# Patient Record
Sex: Female | Born: 1960 | Race: Black or African American | Hispanic: No | Marital: Married | Smoking: Current every day smoker
Health system: Southern US, Community
[De-identification: ages and names within clinical notes are randomized; demographics above are authoritative.]

## PROBLEM LIST (undated history)

## (undated) DIAGNOSIS — D473 Essential (hemorrhagic) thrombocythemia: Secondary | ICD-10-CM

## (undated) DIAGNOSIS — L309 Dermatitis, unspecified: Secondary | ICD-10-CM

## (undated) DIAGNOSIS — B019 Varicella without complication: Secondary | ICD-10-CM

## (undated) DIAGNOSIS — D649 Anemia, unspecified: Secondary | ICD-10-CM

## (undated) DIAGNOSIS — R4586 Emotional lability: Secondary | ICD-10-CM

## (undated) DIAGNOSIS — R05 Cough: Secondary | ICD-10-CM

## (undated) HISTORY — DX: Varicella without complication: B01.9

## (undated) HISTORY — DX: Emotional lability: R45.86

## (undated) HISTORY — DX: Dermatitis, unspecified: L30.9

## (undated) HISTORY — PX: ABDOMINAL HYSTERECTOMY: SHX81

## (undated) HISTORY — DX: Cough: R05

## (undated) HISTORY — PX: DILATION AND CURETTAGE OF UTERUS: SHX78

## (undated) HISTORY — DX: Anemia, unspecified: D64.9

## (undated) HISTORY — DX: Essential (hemorrhagic) thrombocythemia: D47.3

---

## 1999-01-09 ENCOUNTER — Emergency Department (HOSPITAL_COMMUNITY): Admission: EM | Admit: 1999-01-09 | Discharge: 1999-01-09 | Payer: Self-pay | Admitting: Emergency Medicine

## 1999-02-22 ENCOUNTER — Emergency Department (HOSPITAL_COMMUNITY): Admission: EM | Admit: 1999-02-22 | Discharge: 1999-02-22 | Payer: Self-pay | Admitting: Emergency Medicine

## 1999-06-16 ENCOUNTER — Emergency Department (HOSPITAL_COMMUNITY): Admission: EM | Admit: 1999-06-16 | Discharge: 1999-06-16 | Payer: Self-pay | Admitting: Internal Medicine

## 1999-12-22 ENCOUNTER — Emergency Department (HOSPITAL_COMMUNITY): Admission: EM | Admit: 1999-12-22 | Discharge: 1999-12-22 | Payer: Self-pay | Admitting: Emergency Medicine

## 2000-05-19 ENCOUNTER — Emergency Department (HOSPITAL_COMMUNITY): Admission: EM | Admit: 2000-05-19 | Discharge: 2000-05-19 | Payer: Self-pay

## 2000-12-20 ENCOUNTER — Other Ambulatory Visit: Admission: RE | Admit: 2000-12-20 | Discharge: 2000-12-20 | Payer: Self-pay | Admitting: Gynecology

## 2000-12-24 ENCOUNTER — Encounter: Payer: Self-pay | Admitting: Gynecology

## 2000-12-24 ENCOUNTER — Encounter: Admission: RE | Admit: 2000-12-24 | Discharge: 2000-12-24 | Payer: Self-pay | Admitting: Gynecology

## 2000-12-24 ENCOUNTER — Other Ambulatory Visit: Admission: RE | Admit: 2000-12-24 | Discharge: 2000-12-24 | Payer: Self-pay | Admitting: Gynecology

## 2001-01-20 ENCOUNTER — Encounter: Payer: Self-pay | Admitting: Surgery

## 2001-01-20 ENCOUNTER — Encounter (INDEPENDENT_AMBULATORY_CARE_PROVIDER_SITE_OTHER): Payer: Self-pay | Admitting: *Deleted

## 2001-01-20 ENCOUNTER — Ambulatory Visit (HOSPITAL_BASED_OUTPATIENT_CLINIC_OR_DEPARTMENT_OTHER): Admission: RE | Admit: 2001-01-20 | Discharge: 2001-01-20 | Payer: Self-pay | Admitting: Surgery

## 2001-01-20 ENCOUNTER — Encounter: Admission: RE | Admit: 2001-01-20 | Discharge: 2001-01-20 | Payer: Self-pay | Admitting: Surgery

## 2001-01-20 HISTORY — PX: BREAST BIOPSY: SHX20

## 2003-01-26 ENCOUNTER — Other Ambulatory Visit: Admission: RE | Admit: 2003-01-26 | Discharge: 2003-01-26 | Payer: Self-pay | Admitting: Family Medicine

## 2003-08-04 ENCOUNTER — Emergency Department (HOSPITAL_COMMUNITY): Admission: EM | Admit: 2003-08-04 | Discharge: 2003-08-04 | Payer: Self-pay | Admitting: Emergency Medicine

## 2004-09-14 HISTORY — PX: BREAST EXCISIONAL BIOPSY: SUR124

## 2004-12-19 ENCOUNTER — Other Ambulatory Visit: Admission: RE | Admit: 2004-12-19 | Discharge: 2004-12-19 | Payer: Self-pay | Admitting: Obstetrics and Gynecology

## 2005-01-09 ENCOUNTER — Encounter: Admission: RE | Admit: 2005-01-09 | Discharge: 2005-01-09 | Payer: Self-pay | Admitting: Obstetrics and Gynecology

## 2005-01-30 ENCOUNTER — Encounter: Admission: RE | Admit: 2005-01-30 | Discharge: 2005-01-30 | Payer: Self-pay | Admitting: Obstetrics and Gynecology

## 2005-02-06 ENCOUNTER — Encounter (INDEPENDENT_AMBULATORY_CARE_PROVIDER_SITE_OTHER): Payer: Self-pay | Admitting: *Deleted

## 2005-02-06 ENCOUNTER — Encounter: Admission: RE | Admit: 2005-02-06 | Discharge: 2005-02-06 | Payer: Self-pay | Admitting: Obstetrics and Gynecology

## 2005-02-06 HISTORY — PX: BREAST BIOPSY: SHX20

## 2005-03-19 ENCOUNTER — Ambulatory Visit (HOSPITAL_BASED_OUTPATIENT_CLINIC_OR_DEPARTMENT_OTHER): Admission: RE | Admit: 2005-03-19 | Discharge: 2005-03-19 | Payer: Self-pay | Admitting: Surgery

## 2005-03-19 ENCOUNTER — Encounter (INDEPENDENT_AMBULATORY_CARE_PROVIDER_SITE_OTHER): Payer: Self-pay | Admitting: *Deleted

## 2005-03-19 ENCOUNTER — Ambulatory Visit (HOSPITAL_COMMUNITY): Admission: RE | Admit: 2005-03-19 | Discharge: 2005-03-19 | Payer: Self-pay | Admitting: Surgery

## 2005-03-19 ENCOUNTER — Encounter: Admission: RE | Admit: 2005-03-19 | Discharge: 2005-03-19 | Payer: Self-pay | Admitting: Surgery

## 2005-05-20 ENCOUNTER — Emergency Department (HOSPITAL_COMMUNITY): Admission: EM | Admit: 2005-05-20 | Discharge: 2005-05-20 | Payer: Self-pay | Admitting: Emergency Medicine

## 2006-06-11 ENCOUNTER — Encounter: Admission: RE | Admit: 2006-06-11 | Discharge: 2006-06-11 | Payer: Self-pay | Admitting: Obstetrics and Gynecology

## 2007-04-28 ENCOUNTER — Ambulatory Visit: Payer: Self-pay | Admitting: Family Medicine

## 2007-07-22 ENCOUNTER — Other Ambulatory Visit: Admission: RE | Admit: 2007-07-22 | Discharge: 2007-07-22 | Payer: Self-pay | Admitting: Family Medicine

## 2007-07-22 ENCOUNTER — Ambulatory Visit: Payer: Self-pay | Admitting: Family Medicine

## 2007-08-16 ENCOUNTER — Encounter: Admission: RE | Admit: 2007-08-16 | Discharge: 2007-08-16 | Payer: Self-pay | Admitting: Family Medicine

## 2007-08-23 ENCOUNTER — Ambulatory Visit: Payer: Self-pay | Admitting: Family Medicine

## 2007-09-06 ENCOUNTER — Ambulatory Visit: Payer: Self-pay | Admitting: Oncology

## 2008-01-10 ENCOUNTER — Ambulatory Visit: Payer: Self-pay | Admitting: Family Medicine

## 2008-01-18 ENCOUNTER — Ambulatory Visit: Payer: Self-pay | Admitting: Family Medicine

## 2008-01-31 ENCOUNTER — Ambulatory Visit: Payer: Self-pay | Admitting: Oncology

## 2008-03-09 ENCOUNTER — Ambulatory Visit: Payer: Self-pay | Admitting: Oncology

## 2009-02-01 ENCOUNTER — Ambulatory Visit: Payer: Self-pay | Admitting: Family Medicine

## 2009-09-14 HISTORY — PX: BREAST EXCISIONAL BIOPSY: SUR124

## 2010-06-19 ENCOUNTER — Ambulatory Visit: Payer: Self-pay | Admitting: Physician Assistant

## 2010-06-19 ENCOUNTER — Other Ambulatory Visit: Admission: RE | Admit: 2010-06-19 | Discharge: 2010-06-19 | Payer: Self-pay | Admitting: Family Medicine

## 2010-06-25 ENCOUNTER — Encounter: Admission: RE | Admit: 2010-06-25 | Discharge: 2010-06-25 | Payer: Self-pay | Admitting: Family Medicine

## 2010-06-27 ENCOUNTER — Encounter: Admission: RE | Admit: 2010-06-27 | Discharge: 2010-06-27 | Payer: Self-pay | Admitting: Family Medicine

## 2010-06-27 HISTORY — PX: BREAST BIOPSY: SHX20

## 2010-07-28 ENCOUNTER — Encounter: Admission: RE | Admit: 2010-07-28 | Discharge: 2010-07-28 | Payer: Self-pay | Admitting: Surgery

## 2010-07-28 ENCOUNTER — Ambulatory Visit (HOSPITAL_BASED_OUTPATIENT_CLINIC_OR_DEPARTMENT_OTHER): Admission: RE | Admit: 2010-07-28 | Discharge: 2010-07-28 | Payer: Self-pay | Admitting: Surgery

## 2010-10-05 ENCOUNTER — Encounter: Payer: Self-pay | Admitting: Obstetrics and Gynecology

## 2010-11-25 LAB — POCT HEMOGLOBIN-HEMACUE: Hemoglobin: 13.3 g/dL (ref 12.0–15.0)

## 2011-01-30 NOTE — Op Note (Signed)
Tamara Espinoza, Tamara Espinoza              ACCOUNT NO.:  [id number]   MEDICAL RECORD NO.:  [id number]          PATIENT TYPE:  AMB   LOCATION:  DSC                          FACILITY:  [facility]   PHYSICIAN:  Currie Paris, M.D.DATE OF BIRTH:  1960-09-28   DATE OF PROCEDURE:  03/19/2005  DATE OF DISCHARGE:                                 OPERATIVE REPORT   CCS [id number].   PREOPERATIVE DIAGNOSIS:  Left breast mass.   POSTOPERATIVE DIAGNOSIS:  Left breast mass.   OPERATION:  Needle guided excision of left breast mass.   SURGEON:  Dr. Jamey Ripa.   ANESTHESIA:  General.   CLINICAL HISTORY:  This is a 44-hour lady with a mass in the left breast  with some calcifications and a biopsy not clearly diagnostic for benign  disease and excisional biopsy was recommended.   DESCRIPTION OF PROCEDURE:  The patient was seen in the holding area and had  no questions. She confirmed the left side as the operative side and we both  marked the left breast as the operative site.   I reviewed the films and the guidewire appeared to be placed appropriately.  She was therefore taken to the operating room.   After satisfactory general anesthesia had been obtained, the left breast was  prepped and draped. The time-out occurred.   The guidewire entered laterally and tracked medially so I made a transverse  incision over the guidewire tract. I manipulated the guidewire into the  tract and then took a cylinder of tissue around the guidewire starting  laterally working medially going in all directions staying about a  centimeter around it so that we had a wide area to make sure I was  completely around the area of abnormality. The excision continued to the end  of the guidewire. Once this was out, we sent it for specimen mammography. I  palpated the residual breast and just along the superior edge of what I had  just excised was another small mass which might have represented a small  cyst and some very dense  fibrotic breast tissue, but I excised and sent as a  separate specimen.   Once hemostasis was achieved, I infiltrated the area with 0.25% plain  Marcaine. We made a final check for hemostasis and closed in layers with 3-0  Vicryl, 4-0 Monocryl subcuticular, and Dermabond on the skin.   The patient tolerated the procedure well. There were no operative  complications. All counts were correct. Radiology reported that the specimen  mammogram appeared to contain the abnormality.       CJS/MEDQ  D:  03/19/2005  T:  03/19/2005  Job:  [id number]

## 2011-01-30 NOTE — Op Note (Signed)
[place]  Patient:    Tamara Espinoza, Tamara Espinoza                     MRN: [id number] Proc. Date: 01/20/01 Adm. Date:  [id number] Attending:  Charlton Haws CC:         Gretta Cool, M.D.             Dr. Jess Barters, [facility]                           Operative Report  ACCOUNT [id number]. CCS [id number].  PREOPERATIVE DIAGNOSIS:  Left breast mass.  POSTOPERATIVE DIAGNOSIS:  Left breast mass.  PROCEDURE:  Needle-guided excision, left breast mass.  SURGEON:  Currie Paris, M.D.  ANESTHESIA:  General.  CLINICAL HISTORY:  This patient is a 50 year old with a left breast mass that had a biopsy that was not clearly definitive as being benign, and decided to proceed to excisional biopsy for better tissue.  The mass was somewhat palpable but difficult as it was a little bit deep in the breast, and so I decided to have it localized with a guidewire to be sure that it did not slide out of the way as we were trying to locate it.  DESCRIPTION OF PROCEDURE:  The patient was brought to the operating room, having had a small X made on the breast overlying the mass as well as a guidewire, which entered lateral to the mass and entered the mass.  After IV sedation had been attempted, it was clear that the patient was going to not tolerate this well even before we started the incision, so an LMA was one. Nevertheless, I used some local to help with postoperative pain control.  The incision was made over the area of palpable abnormality and some of the subcutaneous tissues down to the breast divided.  I then lifted the skin up by going laterally until I could identify the guidewire and mobilized it into the wound.  Then using some Allis clamps around the area of palpable abnormality, I was able to grab that, including the wire, and using the cutting current of the cautery take an excisional biopsy around the wire, taking about a 1.5 cm core of  tissue around the guidewire.  I could palpate what appeared to be a mass within the specimen, and it appeared to be away from the margins and the margins appeared to be soft and mobile.  The wound was irrigated and checked for hemostasis and when everything appeared to be dry, it was closed in layers with 3-0 Vicryl, followed by 4-0 Monocryl subcuticular plus Steri-Strips.  The patient tolerated the procedure well.  There were no operative complications.  All counts were correct. Radiology did confirm that this specimen contained the abnormality. DD:  01/20/01 TD:  01/21/01 Job: [id number]

## 2011-03-30 ENCOUNTER — Telehealth: Payer: Self-pay | Admitting: Family Medicine

## 2011-03-30 MED ORDER — TRIAMCINOLONE ACETONIDE 0.1 % EX CREA: TOPICAL_CREAM | Freq: Two times a day (BID) | CUTANEOUS | Status: DC

## 2011-03-30 NOTE — Telephone Encounter (Signed)
Call the Medication in.

## 2011-03-30 NOTE — Telephone Encounter (Signed)
IS THIS OK 

## 2011-03-30 NOTE — Telephone Encounter (Signed)
Med has been ordered

## 2011-03-31 ENCOUNTER — Ambulatory Visit (INDEPENDENT_AMBULATORY_CARE_PROVIDER_SITE_OTHER): Payer: Federal, State, Local not specified - PPO | Admitting: Medical

## 2011-03-31 ENCOUNTER — Encounter: Payer: Self-pay | Admitting: Medical

## 2011-03-31 VITALS — BP 122/76 | HR 72 | Temp 97.5°F | Ht 67.0 in | Wt 163.0 lb

## 2011-03-31 DIAGNOSIS — R21 Rash and other nonspecific skin eruption: Secondary | ICD-10-CM

## 2011-03-31 DIAGNOSIS — T7840XA Allergy, unspecified, initial encounter: Secondary | ICD-10-CM

## 2011-03-31 DIAGNOSIS — L259 Unspecified contact dermatitis, unspecified cause: Secondary | ICD-10-CM

## 2011-03-31 DIAGNOSIS — L309 Dermatitis, unspecified: Secondary | ICD-10-CM

## 2011-03-31 MED ORDER — METHYLPREDNISOLONE ACETATE 80 MG/ML IJ SUSP
80.0000 mg | Freq: Once | INTRAMUSCULAR | Status: AC
  Administered 2011-03-31: 80 mg via INTRAMUSCULAR

## 2011-03-31 MED ORDER — TRIAMCINOLONE ACETONIDE 0.1 % EX CREA: TOPICAL_CREAM | Freq: Two times a day (BID) | CUTANEOUS | Status: AC

## 2011-03-31 NOTE — Progress Notes (Signed)
  Subjective:   Here today with new complaint of rash and skin reaction. Over the weekend she tried Oil of Olay body wash for the first time, and by the next day had a rash breaking out on her arms. Over the course of the last day and a half this has spread throughout her body. She took some Benadryl with no improvement, using her triamcinolone cream topically with not much improvement. The rash is spreading, very itchy, and can't get any relief.  In the past she has not had good luck with scented or strong soaps such as [place] soap. No other new exposures. No other aggravating or relieving factors.    She gets bad eczema of her feet, and uses triamcinolone cream topically which helps. She needs a refill of this today.  The following portions of the patient's history were reviewed and updated as appropriate: allergies, current medications, past family history, past medical history, past social history, past surgical history and problem list.  Past Medical History  Diagnosis Date  . Eczema     Review of Systems Gen.: No fever, chills, sweats G I.: No nausea, vomiting, abdominal pain Lungs: No shortness of breath or wheezing Old cavity: No swelling or itching    Objective:   Physical Exam  General appearance: alert, no distress, WD/WN, black female Skin: Generalized prickly, maculopapular, pink to erythematous rash throughout including arms, legs, abdomen, upper back, chest;  feet dorsally with dry and scaly patch, resolving from recent eczema flare   Assessment :    Encounter Diagnoses  Name Primary?  . Allergic reaction Yes  . Rash   . Eczema      Plan:  Advise she avoid Oil of Olay body wash, avoid strong scented soaps and body washes, and stick with Dove sensitive skin that she is use to. Advised she avoid lots of various perfumes and body hygiene products unless they are hypoallergenic.  She can use Benadryl 2 to 3 times a day for the next 2 days, topical triamcinolone as  needed, and Depo-Medrol 80 mg IM given today in office. Call if not improving or worse within the next 2 days. Note given for work.  Eczema-continue daily moisturizing lotion, triamcinolone cream for worse flair.

## 2011-04-02 ENCOUNTER — Telehealth: Payer: Self-pay | Admitting: Medical

## 2011-04-03 ENCOUNTER — Other Ambulatory Visit: Payer: Self-pay | Admitting: Family Medicine

## 2011-04-03 NOTE — Telephone Encounter (Signed)
I saw her note regarding referral.  We had given her 80mg  Depo Medrol.  Has the rash improved at all?  If some improvement, I can call out some additional prednisone which should help.    If she still wants referral to derm, then go ahead.

## 2011-04-03 NOTE — Telephone Encounter (Signed)
Called 3 # left message to call me back

## 2011-04-07 ENCOUNTER — Telehealth: Payer: Self-pay

## 2011-04-07 NOTE — Telephone Encounter (Signed)
HAVE CALLED PT 2 TIMES LEFT MESSAGE ON PHONE AND WITH HUSBAND AND SHE HASN'T RETURN ED CALL

## 2011-04-07 NOTE — Telephone Encounter (Signed)
Left message for pt to call me back for 2nd time

## 2011-06-09 ENCOUNTER — Other Ambulatory Visit: Payer: Self-pay | Admitting: Family Medicine

## 2011-06-09 DIAGNOSIS — Z1231 Encounter for screening mammogram for malignant neoplasm of breast: Secondary | ICD-10-CM

## 2011-06-19 ENCOUNTER — Other Ambulatory Visit (INDEPENDENT_AMBULATORY_CARE_PROVIDER_SITE_OTHER): Payer: Federal, State, Local not specified - PPO

## 2011-06-19 DIAGNOSIS — Z23 Encounter for immunization: Secondary | ICD-10-CM

## 2011-07-03 ENCOUNTER — Ambulatory Visit
Admission: RE | Admit: 2011-07-03 | Discharge: 2011-07-03 | Disposition: A | Discharge status: Home / Self Care | Payer: Federal, State, Local not specified - PPO | Source: Ambulatory Visit | Attending: Family Medicine | Admitting: Family Medicine

## 2011-07-03 DIAGNOSIS — Z1231 Encounter for screening mammogram for malignant neoplasm of breast: Secondary | ICD-10-CM

## 2011-09-21 ENCOUNTER — Encounter: Payer: Self-pay | Admitting: Internal Medicine

## 2011-09-25 ENCOUNTER — Institutional Professional Consult (permissible substitution): Payer: Federal, State, Local not specified - PPO | Admitting: Medical

## 2012-05-04 DIAGNOSIS — Z029 Encounter for administrative examinations, unspecified: Secondary | ICD-10-CM

## 2012-07-11 ENCOUNTER — Encounter: Payer: Federal, State, Local not specified - PPO | Admitting: Family Medicine

## 2012-09-15 ENCOUNTER — Other Ambulatory Visit: Payer: Self-pay | Admitting: Family Medicine

## 2012-09-15 DIAGNOSIS — Z1231 Encounter for screening mammogram for malignant neoplasm of breast: Secondary | ICD-10-CM

## 2012-09-27 ENCOUNTER — Ambulatory Visit (INDEPENDENT_AMBULATORY_CARE_PROVIDER_SITE_OTHER): Payer: Federal, State, Local not specified - PPO | Admitting: Medical

## 2012-09-27 ENCOUNTER — Ambulatory Visit
Admission: RE | Admit: 2012-09-27 | Discharge: 2012-09-27 | Disposition: A | Discharge status: Home / Self Care | Payer: Federal, State, Local not specified - PPO | Source: Ambulatory Visit | Attending: Family Medicine | Admitting: Family Medicine

## 2012-09-27 ENCOUNTER — Encounter: Payer: Self-pay | Admitting: Medical

## 2012-09-27 ENCOUNTER — Other Ambulatory Visit (HOSPITAL_COMMUNITY)
Admission: RE | Admit: 2012-09-27 | Discharge: 2012-09-27 | Disposition: A | Discharge status: Home / Self Care | Payer: Federal, State, Local not specified - PPO | Source: Ambulatory Visit | Attending: Medical | Admitting: Medical

## 2012-09-27 VITALS — BP 120/70 | HR 82 | Temp 98.1°F | Resp 16 | Ht 65.2 in | Wt 173.0 lb

## 2012-09-27 DIAGNOSIS — Z0000 Encounter for general adult medical examination without abnormal findings: Secondary | ICD-10-CM

## 2012-09-27 DIAGNOSIS — B356 Tinea cruris: Secondary | ICD-10-CM

## 2012-09-27 DIAGNOSIS — N920 Excessive and frequent menstruation with regular cycle: Secondary | ICD-10-CM

## 2012-09-27 DIAGNOSIS — R232 Flushing: Secondary | ICD-10-CM

## 2012-09-27 DIAGNOSIS — N951 Menopausal and female climacteric states: Secondary | ICD-10-CM

## 2012-09-27 DIAGNOSIS — F17200 Nicotine dependence, unspecified, uncomplicated: Secondary | ICD-10-CM

## 2012-09-27 DIAGNOSIS — Z23 Encounter for immunization: Secondary | ICD-10-CM

## 2012-09-27 DIAGNOSIS — Z1239 Encounter for other screening for malignant neoplasm of breast: Secondary | ICD-10-CM

## 2012-09-27 DIAGNOSIS — L293 Anogenital pruritus, unspecified: Secondary | ICD-10-CM

## 2012-09-27 DIAGNOSIS — F39 Unspecified mood [affective] disorder: Secondary | ICD-10-CM

## 2012-09-27 DIAGNOSIS — Z124 Encounter for screening for malignant neoplasm of cervix: Secondary | ICD-10-CM

## 2012-09-27 DIAGNOSIS — N760 Acute vaginitis: Secondary | ICD-10-CM | POA: Insufficient documentation

## 2012-09-27 DIAGNOSIS — Z1211 Encounter for screening for malignant neoplasm of colon: Secondary | ICD-10-CM

## 2012-09-27 DIAGNOSIS — Z1231 Encounter for screening mammogram for malignant neoplasm of breast: Secondary | ICD-10-CM

## 2012-09-27 DIAGNOSIS — Z113 Encounter for screening for infections with a predominantly sexual mode of transmission: Secondary | ICD-10-CM | POA: Insufficient documentation

## 2012-09-27 DIAGNOSIS — N898 Other specified noninflammatory disorders of vagina: Secondary | ICD-10-CM

## 2012-09-27 DIAGNOSIS — N852 Hypertrophy of uterus: Secondary | ICD-10-CM

## 2012-09-27 DIAGNOSIS — R4586 Emotional lability: Secondary | ICD-10-CM

## 2012-09-27 DIAGNOSIS — Z01419 Encounter for gynecological examination (general) (routine) without abnormal findings: Secondary | ICD-10-CM | POA: Insufficient documentation

## 2012-09-27 LAB — POCT URINALYSIS DIPSTICK
Bilirubin, UA: NEGATIVE
Blood, UA: NEGATIVE
Glucose, UA: NEGATIVE
Ketones, UA: NEGATIVE
Nitrite, UA: NEGATIVE
Spec Grav, UA: 1.02
Urobilinogen, UA: NEGATIVE
pH, UA: 5

## 2012-09-27 LAB — CBC WITH DIFFERENTIAL/PLATELET
Basophils Relative: 1 % (ref 0–1)
Eosinophils Absolute: 0.2 10*3/uL (ref 0.0–0.7)
Eosinophils Relative: 2 % (ref 0–5)
HCT: 29.7 % — ABNORMAL LOW (ref 36.0–46.0)
Hemoglobin: 8.7 g/dL — ABNORMAL LOW (ref 12.0–15.0)
Lymphocytes Relative: 24 % (ref 12–46)
Lymphs Abs: 2.6 10*3/uL (ref 0.7–4.0)
MCH: 21 pg — ABNORMAL LOW (ref 26.0–34.0)
MCHC: 29.3 g/dL — ABNORMAL LOW (ref 30.0–36.0)
MCV: 71.7 fL — ABNORMAL LOW (ref 78.0–100.0)
Monocytes Absolute: 0.5 10*3/uL (ref 0.1–1.0)
Monocytes Relative: 5 % (ref 3–12)
Neutrophils Relative %: 68 % (ref 43–77)
Platelets: 918 10*3/uL — ABNORMAL HIGH (ref 150–400)
RBC: 4.14 MIL/uL (ref 3.87–5.11)
WBC: 10.8 10*3/uL — ABNORMAL HIGH (ref 4.0–10.5)

## 2012-09-27 LAB — LIPID PANEL
Cholesterol: 206 mg/dL — ABNORMAL HIGH (ref 0–200)
HDL: 36 mg/dL — ABNORMAL LOW (ref >39)
Total CHOL/HDL Ratio: 5.7 Ratio
Triglycerides: 128 mg/dL (ref <150)
VLDL: 26 mg/dL (ref 0–40)

## 2012-09-27 LAB — COMPREHENSIVE METABOLIC PANEL
Albumin: 3.9 g/dL (ref 3.5–5.2)
BUN: 10 mg/dL (ref 6–23)
CO2: 21 mEq/L (ref 19–32)
Glucose, Bld: 81 mg/dL (ref 70–99)
Potassium: 4.4 mEq/L (ref 3.5–5.3)
Sodium: 139 mEq/L (ref 135–145)
Total Protein: 6.6 g/dL (ref 6.0–8.3)

## 2012-09-27 LAB — TSH: TSH: 1.124 u[IU]/mL (ref 0.350–4.500)

## 2012-09-27 NOTE — Progress Notes (Signed)
Subjective:   HPI  Tamara Espinoza is a 52 y.o. female who presents for a complete physical.  Last physical over a year ago.  She went for mammogram this morning.    Preventative care: Last ophthalmology visit:n/a Last dental visit:n/a Last colonoscopy: never Last mammogram:09/27/2012 Last gynecological exam:2012 Last EKG:07/2007  Prior vaccinations: TD or Tdap:unsure Influenza:yes, 2012 Pneumococcal: n/a Shingles/Zostavax: n/a  Concerns: She has concerns about her mood.  In the last year has had issues with irritability, sometimes mood swings, depressed mood at times.  Part of this is being out of work, no job currently.   Thinks she may be entering menopause.  Periods are regular, but heavy, clots, lasts for 5 days, usually heavy for 3 days, has to wear tampon and 2 pads at a time.    She notes vaginal itching x 1-2 months.  No redness, no vaginal discharge, no other visibile changes in the vulva.  Using nothing for this.  No concern for STD.   No prior similar.   Reviewed their medical, surgical, family, social, medication, and allergy history and updated chart as appropriate.   Past Medical History  Diagnosis Date  . Eczema     Past Surgical History  Procedure Date  . Breast biopsy     bilat  . Cesarean section     Family History  Problem Relation Age of Onset  . Cancer Mother 3    colon  . Hypertension Mother   . Other Father     suicide  . Heart disease Father     ?  Marland Kitchen Hypertension Sister   . Stroke Neg Hx   . Diabetes Neg Hx     History   Social History  . Marital Status: Married    Spouse Name: N/A    Number of Children: N/A  . Years of Education: N/A   Occupational History  . Not on file.   Social History Main Topics  . Smoking status: Current Every Day Smoker -- 0.5 packs/day for 25 years    Types: Cigarettes  . Smokeless tobacco: Never Used  . Alcohol Use: 2.0 oz/week    4 drink(s) per week  . Drug Use: No  . Sexually Active: Not on  file   Other Topics Concern  . Not on file   Social History Narrative   Married, 2 children, unemployed currently, living in [place], but coming here for care, exercise calisthenics, walks in warm weather    Current Outpatient Prescriptions on File Prior to Visit  Medication Sig Dispense Refill  . triamcinolone (KENALOG) 0.1 % cream APPLY TOPICALLY TWICE A DAY  30 g  0    Allergies  Allergen Reactions  . Soap     Oil of olay body wash     Review of Systems Constitutional: -fever, -chills, +sweats, +unexpected weight change, -decreased appetite, -fatigue Allergy: -sneezing, +itching, -congestion Dermatology: -changing moles, --rash, -lumps ENT: -runny nose, -ear pain, -sore throat, -hoarseness, -sinus pain, -teeth pain, - ringing in ears, -hearing loss, -nosebleeds Cardiology: -chest pain, -palpitations, -swelling, -difficulty breathing when lying flat, -waking up short of breath Respiratory: -cough, -shortness of breath, -difficulty breathing with exercise or exertion, -wheezing, -coughing up blood Gastroenterology: -abdominal pain, -nausea, -vomiting, -diarrhea, -constipation, -blood in stool, +changes in bowel movement, -difficulty swallowing or eating Hematology: -bleeding, -bruising  Musculoskeletal: -joint aches, -muscle aches, -joint swelling, -back pain, -neck pain, -cramping, -changes in gait Ophthalmology: denies vision changes, eye redness, itching, discharge Urology: -burning with urination, -difficulty urinating, -  blood in urine, -urinary frequency, -urgency, -incontinence Neurology: -headache, -weakness, -tingling, -numbness, -memory loss, -falls, -dizziness Psychology: +depressed mood, -agitation, -sleep problems     Objective:   Physical Exam  Filed Vitals:   09/27/12 0952  BP: 120/70  Pulse: 82  Temp: 98.1 F (36.7 C)  Resp: 16    General appearance: alert, no distress, WD/WN, AA female Skin: scattered benign macules HEENT: normocephalic,  conjunctiva/corneas normal, sclerae anicteric, PERRLA, EOMi, nares patent, no discharge or erythema, pharynx normal Oral cavity: MMM, tongue normal, teeth in good repair Neck: supple, no lymphadenopathy, no thyromegaly, no masses, normal ROM, no bruits Chest: non tender, normal shape and expansion Heart: RRR, normal S1, S2, no murmurs Lungs: CTA bilaterally, no wheezes, rhonchi, or rales Abdomen: +bs, soft, non tender, non distended, no masses, no hepatomegaly, no splenomegaly, no bruits Back: non tender, normal ROM, no scoliosis Musculoskeletal: upper extremities non tender, no obvious deformity, normal ROM throughout, lower extremities non tender, no obvious deformity, normal ROM throughout Extremities: no edema, no cyanosis, no clubbing Pulses: 2+ symmetric, upper and lower extremities, normal cap refill Neurological: alert, oriented x 3, CN2-12 intact, strength normal upper extremities and lower extremities, sensation normal throughout, DTRs 2+ throughout, no cerebellar signs, gait normal Psychiatric: normal affect, behavior normal, pleasant  Breast: lateral, bilateral surgical biopsy scars,nontender, no masses or lumps, no skin changes, no nipple discharge or inversion, no axillary lymphadenopathy Gyn:  normal external genitalia, left superior border of labia major with 2cm dense nodule, seems cystic, unchanged for >1 year per pt, no other external lesions, vagina with normal mucosa, cervix without lesions, no cervical motion tenderness, creamy white discharge.  Uterus and right adnexa enlarged, likely fibroids,but nontender.  Pap performed.  Exam chaperoned by nurse. Rectal: anus normal appearing    Assessment and Plan :    Encounter Diagnoses  Name Primary?  . Routine general medical examination at a health care facility Yes  . Mood changes   . Hot flashes   . Heavy periods   . Screening for cervical cancer   . Screen for colon cancer   . Screening for breast cancer   . Need for  influenza vaccination   . Need for Tdap vaccination   . Need for pneumococcal vaccination   . Tobacco use disorder   . Vaginal itching     Physical exam - discussed healthy lifestyle, diet, exercise, preventative care, vaccinations, and addressed their concerns.  Handout given.  Mood changes, hot flashes - likely perimenopausal.  Pending labs will consider recommendations  Heavy periods - abnormal pelvic exam, likely fibroid uterus.   Will check pelvic ultrasound.  Pap sent.  F/u pending studies.   Screening for cancer - had mammogram this morning, will await results, pap sent, will refer to for screening colonoscopy after May 2014 when she returns 52yo.  Vaccine counseling, VIS and flu vaccine given Vaccine counseling, VIS and Tdap vaccine given Vaccine counseling, VIS and pneumococcal vaccine given  Tobacco use - advised she completely stop tobacco  Vaginal itching - pending pap.  Wet prep with no obvious trich, yeast, or BV.  etiology unclear.  Follow-up pending studies, labs.

## 2012-09-28 ENCOUNTER — Other Ambulatory Visit: Payer: Self-pay | Admitting: Medical

## 2012-09-28 LAB — PATHOLOGIST SMEAR REVIEW

## 2012-09-28 MED ORDER — FERROUS GLUCONATE 325 (36 FE) MG PO TABS: 1.0000 | ORAL_TABLET | Freq: Three times a day (TID) | ORAL | Status: DC

## 2012-10-05 ENCOUNTER — Other Ambulatory Visit: Payer: Federal, State, Local not specified - PPO

## 2012-10-17 ENCOUNTER — Ambulatory Visit
Admission: RE | Admit: 2012-10-17 | Discharge: 2012-10-17 | Disposition: A | Discharge status: Home / Self Care | Payer: Federal, State, Local not specified - PPO | Source: Ambulatory Visit | Attending: Medical | Admitting: Medical

## 2012-10-17 DIAGNOSIS — N852 Hypertrophy of uterus: Secondary | ICD-10-CM

## 2012-10-17 DIAGNOSIS — N920 Excessive and frequent menstruation with regular cycle: Secondary | ICD-10-CM

## 2012-10-21 ENCOUNTER — Telehealth: Payer: Self-pay | Admitting: Family Medicine

## 2012-10-21 NOTE — Telephone Encounter (Signed)
Patient is aware of her appointment at [facility] on 10/24/12 @ 1025 am. CLS [phone] [address] [place], [place]

## 2012-12-05 ENCOUNTER — Telehealth: Payer: Self-pay | Admitting: Family Medicine

## 2012-12-05 ENCOUNTER — Encounter: Payer: Self-pay | Admitting: Internal Medicine

## 2012-12-05 NOTE — Telephone Encounter (Signed)
Patient is aware of her colonscopy appointment on 02/17/13 @ 800 am with Dr. Leone Payor and her nurse visit on may 23.14 @ 100 pm. CLS [phone]

## 2013-02-17 ENCOUNTER — Encounter: Payer: Federal, State, Local not specified - PPO | Admitting: Internal Medicine

## 2013-09-19 ENCOUNTER — Other Ambulatory Visit: Payer: Self-pay

## 2013-09-19 DIAGNOSIS — Z1231 Encounter for screening mammogram for malignant neoplasm of breast: Secondary | ICD-10-CM

## 2013-10-05 ENCOUNTER — Ambulatory Visit: Payer: Federal, State, Local not specified - PPO

## 2013-10-20 ENCOUNTER — Ambulatory Visit
Admission: RE | Admit: 2013-10-20 | Discharge: 2013-10-20 | Disposition: A | Discharge status: Home / Self Care | Payer: Federal, State, Local not specified - PPO | Source: Ambulatory Visit

## 2013-10-20 DIAGNOSIS — Z1231 Encounter for screening mammogram for malignant neoplasm of breast: Secondary | ICD-10-CM

## 2013-11-21 ENCOUNTER — Encounter: Payer: Self-pay | Admitting: Medical

## 2013-11-21 ENCOUNTER — Ambulatory Visit (INDEPENDENT_AMBULATORY_CARE_PROVIDER_SITE_OTHER): Payer: Federal, State, Local not specified - PPO | Admitting: Medical

## 2013-11-21 VITALS — BP 110/70 | HR 56 | Temp 98.2°F | Resp 14 | Wt 172.0 lb

## 2013-11-21 DIAGNOSIS — F3289 Other specified depressive episodes: Secondary | ICD-10-CM

## 2013-11-21 DIAGNOSIS — F329 Major depressive disorder, single episode, unspecified: Secondary | ICD-10-CM

## 2013-11-21 MED ORDER — VENLAFAXINE HCL 37.5 MG PO TABS: 37.5000 mg | ORAL_TABLET | Freq: Two times a day (BID) | ORAL | Status: DC

## 2013-11-21 NOTE — Patient Instructions (Signed)
Thank you for giving me the opportunity to serve you today.    Your diagnosis today includes: Encounter Diagnosis  Name Primary?  . Menopausal depression Yes     Specific recommendations today include:  Begin Effexor, 1 tablet daily for 5 days, then go to 1 tablet twice daily  Make sure you are drinking plenty of water  continue using fans, layer clothing, and other measures for hot flashes  If any problems with the medication, call or return right away  Follow up: 3-4 weeks   I have included other useful information below for your review.  Menopause Menopause is the normal time of life when menstrual periods stop completely. Menopause is complete when you have missed 12 consecutive menstrual periods. It usually occurs between the ages of 57 years and 36 years. Very rarely does a woman develop menopause before the age of 77 years. At menopause, your ovaries stop producing the female hormones estrogen and progesterone. This can cause undesirable symptoms and also affect your health. Sometimes the symptoms may occur 4 5 years before the menopause begins. There is no relationship between menopause and:  Oral contraceptives.  Number of children you had.  Race.  The age your menstrual periods started (menarche). Heavy smokers and very thin women may develop menopause earlier in life. CAUSES  The ovaries stop producing the female hormones estrogen and progesterone.  Other causes include:  Surgery to remove both ovaries.  The ovaries stop functioning for no known reason.  Tumors of the pituitary gland in the brain.  Medical disease that affects the ovaries and hormone production.  Radiation treatment to the abdomen or pelvis.  Chemotherapy that affects the ovaries. SYMPTOMS   Hot flashes.  Night sweats.  Decrease in sex drive.  Vaginal dryness and thinning of the vagina causing painful intercourse.  Dryness of the skin and developing  wrinkles.  Headaches.  Tiredness.  Irritability.  Memory problems.  Weight gain.  Bladder infections.  Hair growth of the face and chest.  Infertility. More serious symptoms include:  Loss of bone (osteoporosis) causing breaks (fractures).  Depression.  Hardening and narrowing of the arteries (atherosclerosis) causing heart attacks and strokes. DIAGNOSIS   When the menstrual periods have stopped for 12 straight months.  Physical exam.  Hormone studies of the blood. TREATMENT  There are many treatment choices and nearly as many questions about them. The decisions to treat or not to treat menopausal changes is an individual choice made with your health care provider. Your health care provider can discuss the treatments with you. Together, you can decide which treatment will work best for you. Your treatment choices may include:   Hormone therapy (estrogen and progesterone).  Non-hormonal medicines.  Treating the individual symptoms with medicine (for example antidepressants for depression).  Herbal medicines that may help specific symptoms.  Counseling by a psychiatrist or psychologist.  Group therapy.  Lifestyle changes including:  Eating healthy.  Regular exercise.  Limiting caffeine and alcohol.  Stress management and meditation.  No treatment. HOME CARE INSTRUCTIONS   Take the medicine your health care provider gives you as directed.  Get plenty of sleep and rest.  Exercise regularly.  Eat a diet that contains calcium (good for the bones) and soy products (acts like estrogen hormone).  Avoid alcoholic beverages.  Do not smoke.  If you have hot flashes, dress in layers.  Take supplements, calcium, and vitamin D to strengthen bones.  You can use over-the-counter lubricants or moisturizers for vaginal dryness.  Group therapy is sometimes very helpful.  Acupuncture may be helpful in some cases. SEEK MEDICAL CARE IF:   You are not sure  you are in menopause.  You are having menopausal symptoms and need advice and treatment.  You are still having menstrual periods after age 79 years.  You have pain with intercourse.  Menopause is complete (no menstrual period for 12 months) and you develop vaginal bleeding.  You need a referral to a specialist (gynecologist, psychiatrist, or psychologist) for treatment. SEEK IMMEDIATE MEDICAL CARE IF:   You have severe depression.  You have excessive vaginal bleeding.  You fell and think you have a broken bone.  You have pain when you urinate.  You develop leg or chest pain.  You have a fast pounding heart beat (palpitations).  You have severe headaches.  You develop vision problems.  You feel a lump in your breast.  You have abdominal pain or severe indigestion. Document Released: 11/21/2003 Document Revised: 05/03/2013 Document Reviewed: 03/30/2013 [facility] Patient Information 2014 [place], [place].   Smoking Cessation  1-800-QUIT-NOW  Quitting smoking is important to your health and has many advantages. However, it is not always easy to quit since nicotine is a very addictive drug. Often times, people try 3 times or more before being able to quit. This document explains the best ways for you to prepare to quit smoking. Quitting takes hard work and a lot of effort, but you can do it. ADVANTAGES OF QUITTING SMOKING  You will live longer, feel better, and live better.  Your body will feel the impact of quitting smoking almost immediately.  Within 20 minutes, blood pressure decreases. Your pulse returns to its normal level.  After 8 hours, carbon monoxide levels in the blood return to normal. Your oxygen level increases.  After 24 hours, the chance of having a heart attack starts to decrease. Your breath, hair, and body stop smelling like smoke.  After 48 hours, damaged nerve endings begin to recover. Your sense of taste and smell improve.  After 72 hours, the  body is virtually free of nicotine. Your bronchial tubes relax and breathing becomes easier.  After 2 to 12 weeks, lungs can hold more air. Exercise becomes easier and circulation improves.  The risk of having a heart attack, stroke, cancer, or lung disease is greatly reduced.  After 1 year, the risk of coronary heart disease is cut in half.  After 5 years, the risk of stroke falls to the same as a nonsmoker.  After 10 years, the risk of lung cancer is cut in half and the risk of other cancers decreases significantly.  After 15 years, the risk of coronary heart disease drops, usually to the level of a nonsmoker.  If you are pregnant, quitting smoking will improve your chances of having a healthy baby.  The people you live with, especially any children, will be healthier.  You will have extra money to spend on things other than cigarettes. QUESTIONS TO THINK ABOUT BEFORE ATTEMPTING TO QUIT You may want to talk about your answers with your caregiver.  Why do you want to quit?  If you tried to quit in the past, what helped and what did not?  What will be the most difficult situations for you after you quit? How will you plan to handle them?  Who can help you through the tough times? Your family? Friends? A caregiver?  What pleasures do you get from smoking? What ways can you still get pleasure if  you quit? Here are some questions to ask your caregiver:  How can you help me to be successful at quitting?  What medicine do you think would be best for me and how should I take it?  What should I do if I need more help?  What is smoking withdrawal like? How can I get information on withdrawal? GET READY  Set a quit date.  Change your environment by getting rid of all cigarettes, ashtrays, matches, and lighters in your home, car, or work. Do not let people smoke in your home.  Review your past attempts to quit. Think about what worked and what did not. GET SUPPORT AND  ENCOURAGEMENT You have a better chance of being successful if you have help. You can get support in many ways.  Tell your family, friends, and co-workers that you are going to quit and need their support. Ask them not to smoke around you.  Get individual, group, or telephone counseling and support. Programs are available at [employer] and health centers. Call your local health department for information about programs in your area.  Spiritual beliefs and practices may help some smokers quit.  Download a "quit meter" on your computer to keep track of quit statistics, such as how long you have gone without smoking, cigarettes not smoked, and money saved.  Get a self-help book about quitting smoking and staying off of tobacco. [place] yourself from urges to smoke. Talk to someone, go for a walk, or occupy your time with a task.  Change your normal routine. Take a different route to work. Drink tea instead of coffee. Eat breakfast in a different place.  Reduce your stress. Take a hot bath, exercise, or read a book.  Plan something enjoyable to do every day. Reward yourself for not smoking.  Explore interactive web-based programs that specialize in helping you quit. GET MEDICINE AND USE IT CORRECTLY Medicines can help you stop smoking and decrease the urge to smoke. Combining medicine with the above behavioral methods and support can greatly increase your chances of successfully quitting smoking.  Nicotine replacement therapy helps deliver nicotine to your body without the negative effects and risks of smoking. Nicotine replacement therapy includes nicotine gum, lozenges, inhalers, nasal sprays, and skin patches. Some may be available over-the-counter and others require a prescription.  Antidepressant medicine helps people abstain from smoking, but how this works is unknown. This medicine is available by prescription.  Nicotinic receptor partial agonist  medicine simulates the effect of nicotine in your brain. This medicine is available by prescription. Ask your caregiver for advice about which medicines to use and how to use them based on your health history. Your caregiver will tell you what side effects to look out for if you choose to be on a medicine or therapy. Carefully read the information on the package. Do not use any other product containing nicotine while using a nicotine replacement product.  RELAPSE OR DIFFICULT SITUATIONS Most relapses occur within the first 3 months after quitting. Do not be discouraged if you start smoking again. Remember, most people try several times before finally quitting. You may have symptoms of withdrawal because your body is used to nicotine. You may crave cigarettes, be irritable, feel very hungry, cough often, get headaches, or have difficulty concentrating. The withdrawal symptoms are only temporary. They are strongest when you first quit, but they will go away within 10 14 days. To reduce the chances of relapse, try to:  Avoid drinking alcohol. Drinking lowers your chances of successfully quitting.  Reduce the amount of caffeine you consume. Once you quit smoking, the amount of caffeine in your body increases and can give you symptoms, such as a rapid heartbeat, sweating, and anxiety.  Avoid smokers because they can make you want to smoke.  Do not let weight gain distract you. Many smokers will gain weight when they quit, usually less than 10 pounds. Eat a healthy diet and stay active. You can always lose the weight gained after you quit.  Find ways to improve your mood other than smoking. FOR MORE INFORMATION  www.smokefree.gov  Document Released: 08/25/2001 Document Revised: 03/01/2012 Document Reviewed: 12/10/2011 [facility] Patient Information 2014 [place], [place].

## 2013-11-21 NOTE — Progress Notes (Signed)
   Subjective:   Tamara Espinoza is a 53 y.o. female presenting on 11/21/2013 with emotional issues and discuss smoking cessation options  I last saw her 09/2012 for physical, but she had moved to [place] this past year, and now has returned back to [place] , hopefully for good.  Had hysterectomy 12/2012 in [place].  Since then has bad mood swings.  Husband accompanies her today.   Says she stays emotional, other times "breathing fire."  At times gets crying spells, sometimes daily.  These symptoms weren't a problem prior to the hysterectomy.   Father committed suicide, but other than him, denies family hx/o mental health problems.  No SI/HI.  No personal hx/o depression.   No prior medication for "nerves."   Gets hot flashes often, night sweats.  Uses ceiling fan often, no clothes to bed, while husband is freezing in the covers.  Has been taking Vivelle Dot patch since this past April, but stopped this a month ago.   Was making her feel funny, gave her leg aches too.  The leg symptoms have improved since stopping Vivelle.  In the past year has moved twice.  She stopped working due to peripheral neuropathy.  Takes Gabapentin for peripheral neuropathy.  Wants to quit tobacco.  Has smoked since age 38yo, but usually smokes 15 cigarettes daily/1/2 pack.  Using nicorette gum, doing e-cigarettes, trying to wean off tobacco.  No other complaint.  Review of Systems ROS as in subjective      Objective:    Filed Vitals:   11/21/13 1041  BP: 110/70  Pulse: 56  Temp: 98.2 F (36.8 C)  Resp: 14    General appearance: alert, no distress, WD/WN Psych: pleasant, good eye contact, answers questions appropratiely      Assessment: Encounter Diagnosis  Name Primary?  . Menopausal depression Yes     Plan: Discussed her concerns, hysterectomy, experience with Vivelle Dot, and smoking cessation attempts.  She is motivated to stop tobacco at this time.   We discussed ways to deal with  menopausal symptoms in general including hot flashes and mood. We discussed possible medications that may help.   discussed risks and benefits of Effexor.  Begin trial of Effexor.  Call/return immediately if problems.  Otherwise f/u 3-4 wk.  Handouts given on tobacco cessation and menopause.  She has stopped Vivelle Dot at this time.   [name] was seen today for emotional issues and discuss smoking cessation options.  Diagnoses and associated orders for this visit:  Menopausal depression  Other Orders - venlafaxine (EFFEXOR) 37.5 MG tablet; Take 1 tablet (37.5 mg total) by mouth 2 (two) times daily with a meal.     Return 3-4 wk.

## 2013-12-12 ENCOUNTER — Encounter: Payer: Self-pay | Admitting: Medical

## 2013-12-12 ENCOUNTER — Telehealth: Payer: Self-pay | Admitting: Internal Medicine

## 2013-12-12 ENCOUNTER — Ambulatory Visit (INDEPENDENT_AMBULATORY_CARE_PROVIDER_SITE_OTHER): Payer: Federal, State, Local not specified - PPO | Admitting: Medical

## 2013-12-12 VITALS — BP 120/80 | HR 76 | Temp 98.0°F | Resp 16 | Wt 170.0 lb

## 2013-12-12 DIAGNOSIS — F329 Major depressive disorder, single episode, unspecified: Secondary | ICD-10-CM

## 2013-12-12 DIAGNOSIS — F3289 Other specified depressive episodes: Secondary | ICD-10-CM

## 2013-12-12 DIAGNOSIS — F17200 Nicotine dependence, unspecified, uncomplicated: Secondary | ICD-10-CM

## 2013-12-12 DIAGNOSIS — G609 Hereditary and idiopathic neuropathy, unspecified: Secondary | ICD-10-CM

## 2013-12-12 DIAGNOSIS — R232 Flushing: Secondary | ICD-10-CM

## 2013-12-12 DIAGNOSIS — G629 Polyneuropathy, unspecified: Secondary | ICD-10-CM

## 2013-12-12 DIAGNOSIS — N951 Menopausal and female climacteric states: Secondary | ICD-10-CM

## 2013-12-12 MED ORDER — VENLAFAXINE HCL 37.5 MG PO TABS: 37.5000 mg | ORAL_TABLET | Freq: Two times a day (BID) | ORAL | Status: DC

## 2013-12-12 NOTE — Progress Notes (Signed)
Subjective:   Tamara Espinoza is a 53 y.o. female presenting on 12/12/2013 with Follow-up  I saw her about 3 weeks ago for menopausal depression.  Husband accompanies her today.  Since then she has started Effexor BID, and this is working quite well.  She notes much improvement on mood, no additional crying spells, hot flashes improving, level in mood and less agitated or quick to get upset or angry.  Feels like she wants to c/t medication as is.   No major change in desire for tobacco, still smoking . 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  Excited about this.  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  At last visit we discussed the following: I last saw her 09/2012 for physical, but she had moved to [place] this past year, and now has returned back to [place], hopefully for good.  Had hysterectomy 12/2012 in [place].  Since then has bad mood swings.  Husband accompanies her today.   Says she stays emotional, other times "breathing fire."  At times gets crying spells, sometimes daily.  These symptoms weren't a problem prior to the hysterectomy.   Father committed suicide, but other than him, denies family hx/o mental health problems.  No SI/HI.  No personal hx/o depression.   No prior medication for "nerves."   Gets hot flashes often, night sweats.  Uses ceiling fan often, no clothes to bed, while husband is freezing in the covers.  Has been taking Vivelle Dot patch since this past April, but stopped this a month ago.   Was making her feel funny, gave her leg aches too.  The leg symptoms have improved since stopping Vivelle.  In the past year has moved twice.  She stopped working due to peripheral neuropathy.  Takes Gabapentin for peripheral neuropathy.  Wants to quit tobacco.  Has smoked since age 12yo, but usually smokes 15 cigarettes daily/1/2 pack.  Using nicorette gum, doing e-cigarettes, trying to wean  off tobacco.  No other complaint.  Review of Systems       Objective:    Filed Vitals:   12/12/13 1029  BP: 120/80  Pulse: 76  Temp: 98 F (36.7 C)  Resp: 16    General appearance: alert, no distress, WD/WN Psych: pleasant, good eye contact, answers questions appropratiely      Assessment: Encounter Diagnoses  Name Primary?  . Menopausal depression Yes  . Hot flashes   . Tobacco use disorder   . Peripheral neuropathy      Plan: Discussed her concerns, hysterectomy, experience with Vivelle Dot, and smoking cessation attempts.  She is motivated to stop tobacco at this time.   We discussed ways to deal with menopausal symptoms in general including hot flashes and mood.  She has stopped Vivelle Dot.   Doing well on Effexor.  C/t this BID, c/t lifestlye changes to deal with menopausal symptoms, c/t Gabapentin as usual for neuropathy.    discussed increasing effexor or adding clonidine at night, but she wants to stay at current dose on Effexor.  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 We will request prior records from her last physical and labs in [place], [place].  Denia was seen today for follow-up.  Diagnoses and associated orders for this visit:  Menopausal depression  Hot flashes  Tobacco use disorder  Peripheral neuropathy  Other Orders -  venlafaxine (EFFEXOR) 37.5 MG tablet; Take 1 tablet (37.5 mg total) by mouth 2 (two) times daily with a meal.    F/u 3-6 mo

## 2013-12-12 NOTE — Telephone Encounter (Signed)
Faxed a medical records form to Dr. Cephus Slater @ [phone]

## 2013-12-12 NOTE — Telephone Encounter (Signed)
[facility] received from [facility] in [place]

## 2013-12-18 ENCOUNTER — Encounter: Payer: Self-pay | Admitting: Medical

## 2014-01-22 ENCOUNTER — Emergency Department: Payer: Self-pay | Admitting: Emergency Medicine

## 2014-01-23 ENCOUNTER — Ambulatory Visit: Payer: Federal, State, Local not specified - PPO | Admitting: Medical

## 2014-02-08 ENCOUNTER — Emergency Department: Payer: Self-pay | Admitting: Emergency Medicine

## 2014-02-08 LAB — BASIC METABOLIC PANEL
Anion Gap: 6 — ABNORMAL LOW (ref 7–16)
BUN: 16 mg/dL (ref 7–18)
Calcium, Total: 9.5 mg/dL (ref 8.5–10.1)
Chloride: 107 mmol/L (ref 98–107)
Co2: 25 mmol/L (ref 21–32)
Creatinine: 0.68 mg/dL (ref 0.60–1.30)
EGFR (Non-African Amer.): >60
Glucose: 110 mg/dL — ABNORMAL HIGH (ref 65–99)
OSMOLALITY: 278 (ref 275–301)
Potassium: 3.5 mmol/L (ref 3.5–5.1)
SODIUM: 138 mmol/L (ref 136–145)

## 2014-02-08 LAB — CBC
HCT: 43.1 % (ref 35.0–47.0)
HGB: 14.4 g/dL (ref 12.0–16.0)
MCH: 29.7 pg (ref 26.0–34.0)
MCHC: 33.4 g/dL (ref 32.0–36.0)
MCV: 89 fL (ref 80–100)
PLATELETS: 570 10*3/uL — AB (ref 150–440)
RBC: 4.84 10*6/uL (ref 3.80–5.20)
RDW: 13.7 % (ref 11.5–14.5)
WBC: 10.7 10*3/uL (ref 3.6–11.0)

## 2014-02-08 LAB — CK: CK, Total: 87 U/L

## 2014-02-09 LAB — LACTATE DEHYDROGENASE: LDH: 170 U/L (ref 81–246)

## 2014-02-12 ENCOUNTER — Ambulatory Visit (INDEPENDENT_AMBULATORY_CARE_PROVIDER_SITE_OTHER): Payer: Federal, State, Local not specified - PPO | Admitting: Medical

## 2014-02-12 ENCOUNTER — Encounter: Payer: Self-pay | Admitting: Medical

## 2014-02-12 VITALS — BP 120/80 | HR 60 | Temp 98.1°F | Resp 16 | Wt 174.0 lb

## 2014-02-12 DIAGNOSIS — D473 Essential (hemorrhagic) thrombocythemia: Secondary | ICD-10-CM

## 2014-02-12 DIAGNOSIS — G609 Hereditary and idiopathic neuropathy, unspecified: Secondary | ICD-10-CM

## 2014-02-12 DIAGNOSIS — M79609 Pain in unspecified limb: Secondary | ICD-10-CM

## 2014-02-12 DIAGNOSIS — G629 Polyneuropathy, unspecified: Secondary | ICD-10-CM

## 2014-02-12 DIAGNOSIS — D75839 Thrombocytosis, unspecified: Secondary | ICD-10-CM

## 2014-02-12 LAB — HEPATIC FUNCTION PANEL
ALBUMIN: 4.5 g/dL (ref 3.5–5.2)
ALT: 13 U/L (ref 0–35)
AST: 18 U/L (ref 0–37)
Alkaline Phosphatase: 130 U/L — ABNORMAL HIGH (ref 39–117)
Bilirubin, Direct: 0.1 mg/dL (ref 0.0–0.3)
Indirect Bilirubin: 0.5 mg/dL (ref 0.2–1.2)
TOTAL PROTEIN: 7.7 g/dL (ref 6.0–8.3)
Total Bilirubin: 0.6 mg/dL (ref 0.2–1.2)

## 2014-02-12 LAB — SEDIMENTATION RATE: SED RATE: 9 mm/h (ref 0–22)

## 2014-02-12 NOTE — Progress Notes (Signed)
   Subjective:   Tamara Espinoza is a 53 y.o. female presenting on 02/12/2014 with Follow-up  Here for 2 issues today  She went to the emergency department a few nights ago for worsening pain and tingling in her arms and legs, was found to have elevated platelets told to followup here.  She denies history of thrombocytosis are elevated platelets in self or family.  She has ongoing tingling and pain in both upper and lower extremities, but worse on the left side.  She was put on gabapentin this past year for neuropathy, had nerve conduction studies of the legs.  She doesn't recall specific calls found, but after discussing causes she does note a history of significant alcohol consumption over 10 years in the remote past.  She rarely drinks alcohol at this time  She is currently taking gabapentin twice daily no prior nerve conduction studies of arm.  She felt the symptoms may be related to the Effexor that we started recently.  No other injury, or fall recently.  No other aggravating or relieving factors.  No other complaint.  Review of Systems ROS as in subjective      Objective:    Filed Vitals:   02/12/14 1141  BP: 120/80  Pulse: 60  Temp: 98.1 F (36.7 C)  Resp: 16    General appearance: alert, no distress, WD/WN Neck: supple, no lymphadenopathy, no thyromegaly, no masses, normal range of motion Heart: RRR, normal S1, S2, no murmurs Lungs: CTA bilaterally, no wheezes, rhonchi, or rales MSK: Arms and legs nontender, no obvious deformity Pulses: 2+ symmetric, upper and lower extremities, normal cap refill Neuro: Normal upper and lower extremities strength throughout, DTRs normal, sensation somewhat blunted to sharp and light touch of arms and legs throughout in more of a stocking glove deformity, but proximal extremities seem to be relatively normal sensation, otherwise CN 2-12 intact, normal gait     Assessment: Encounter Diagnoses  Name Primary?  . Peripheral neuropathy  Yes  . Pain in extremity   . Thrombocytosis      Plan: Thrombocytosis-reviewed her labs from the emergency department, plan to repeat platelets/CBC again in one month  Pain extremities, neuropathy-we will need to get a copy of prior nerve conduction studies.  Additional labs today. Pending labs we'll likely increase gabapentin  Kallyn was seen today for follow-up.  Diagnoses and associated orders for this visit:  Peripheral neuropathy - Vitamin B12 - Folate - Hepatic Function Panel - Vitamin D, 25-hydroxy - Sedimentation rate  Pain in extremity  Thrombocytosis    Return pending labs.

## 2014-02-13 ENCOUNTER — Other Ambulatory Visit: Payer: Self-pay | Admitting: Medical

## 2014-02-13 LAB — VITAMIN B12: VITAMIN B 12: 426 pg/mL (ref 211–911)

## 2014-02-13 LAB — VITAMIN D 25 HYDROXY (VIT D DEFICIENCY, FRACTURES): VIT D 25 HYDROXY: 49 ng/mL (ref 30–89)

## 2014-02-13 LAB — FOLATE: Folate: >20 ng/mL

## 2014-02-13 MED ORDER — GABAPENTIN 300 MG PO CAPS: 300.0000 mg | ORAL_CAPSULE | Freq: Three times a day (TID) | ORAL | Status: DC

## 2014-02-16 ENCOUNTER — Encounter: Payer: Self-pay | Admitting: Medical

## 2014-03-19 ENCOUNTER — Ambulatory Visit: Payer: Self-pay | Admitting: Medical

## 2014-03-20 ENCOUNTER — Encounter: Payer: Self-pay | Admitting: Medical

## 2014-03-20 ENCOUNTER — Ambulatory Visit (INDEPENDENT_AMBULATORY_CARE_PROVIDER_SITE_OTHER): Payer: Federal, State, Local not specified - PPO | Admitting: Medical

## 2014-03-20 VITALS — BP 110/70 | HR 78 | Temp 98.2°F | Resp 16 | Wt 171.0 lb

## 2014-03-20 DIAGNOSIS — D75839 Thrombocytosis, unspecified: Secondary | ICD-10-CM

## 2014-03-20 DIAGNOSIS — F410 Panic disorder [episodic paroxysmal anxiety] without agoraphobia: Secondary | ICD-10-CM

## 2014-03-20 DIAGNOSIS — G609 Hereditary and idiopathic neuropathy, unspecified: Secondary | ICD-10-CM

## 2014-03-20 DIAGNOSIS — F17200 Nicotine dependence, unspecified, uncomplicated: Secondary | ICD-10-CM

## 2014-03-20 DIAGNOSIS — G629 Polyneuropathy, unspecified: Secondary | ICD-10-CM

## 2014-03-20 DIAGNOSIS — R252 Cramp and spasm: Secondary | ICD-10-CM

## 2014-03-20 DIAGNOSIS — D473 Essential (hemorrhagic) thrombocythemia: Secondary | ICD-10-CM

## 2014-03-20 MED ORDER — ALPRAZOLAM 0.5 MG PO TABS: 0.5000 mg | ORAL_TABLET | Freq: Every evening | ORAL | Status: DC | PRN

## 2014-03-20 NOTE — Progress Notes (Signed)
Subjective:    Patient ID: Tamara Espinoza, female    DOB: 26-Jan-1961, 53 y.o.   MRN: [id number]  HPI  Patient is a 53 y.o. female presenting for follow up on peripheral neuropathy.  Patient did increase Gabapentin to 300mg  TID. She reports some improvement in her symptoms. Continues to have numbness and tingling in both feet, hands and arms L > R,and hands > feet x last few years. She also reports that her arms "lock up" and become difficult for her to move to the point that she is not able to pick things up or use her hands the way she normally would. She has symptoms 2-3 times per week lasting less than 5 minutes. When symptoms do occur, movement helps it resolve.  Lately these locking up/cramping of arms cause her to get SOB, gets panicky, cries out to husband who feels helpless on what to do other than have her stretch the arms.  She cannot identify any precipitating/aggravating factors although she does note that her symptoms started after she began Effexor a year ago due to mood swings from Menopause. She does stay hydrated throughout the day. She stays active and exercises regularly. No prior use of Lyrica.   She has had nerve conduction study completed on her legs at [facility] and as far as she can remember there were no abnormalities. We have not yet obtained records of this.  Patient rarely drinks alcohol now, but has abused alcohol in the past.  Has a >30 year history of smoking 1/2ppd, still currently smoking.  Also here for f/u on thrombocytosis, due for lab today.  Patient has no other questions or concerns.  Review of Systems As in subjective.    Objective:   Physical Exam   BP 110/70  Pulse 78  Temp(Src) 98.2 F (36.8 C) (Oral)  Resp 16  Wt 171 lb (77.565 kg)   General appearance: alert, no distress, WD/WN, AA female Neck: No carotid bruits, no thyromegaly, no masses  Heart: RRR, normal S1, S2, no murmurs Musculoskeletal: nontender, no swelling, no  obvious deformity, full active and passive ROM in upper and lower extremities Extremities: no edema, no cyanosis, no clubbing Pulses: 2+ symmetric, upper and lower extremities, normal cap refill Neurological: strength 5/5 upper extremities and lower extremities, sensation normal throughout, DTRs 2+ throughout, - tinels and phalens Psychiatric: normal affect, behavior normal, pleasant, crying spells at times       Assessment & Plan:   Encounter Diagnoses  Name Primary?  . Thrombocytosis Yes  . Cramp in limb   . Peripheral neuropathy   . Panic attack   . Tobacco use disorder    Thrombocytosis - repeat CBC with diff, likely referral to hematology  Cramp in limb - of note, BMET in May 2015 was ok except potassium at low end of normal.  Advised she increase fruti intake, c/t good water intake, c/t good vegetable intake.   Peripheral neuropathy - will try again to get copy of leg NCS/EMG.  Consider arm NCS.  C/t gabapentin 300mg  TID.  Consider change to Lyrica.  Her neuropathy may be related to prior ETOH abuse.  She reportedly had normal EMG/NCS and normal ABIs this past year.   She is not diabetic.  Recent labs show normal B12 and folate.    Panic attack - she is having some episodes that sound like anxiety when her arms lock up.  Can use Xanax prn for this.  Discussed risks/benefits.    Patient  was seen in conjunction with PA student Jaynee Eagles, and I have also evaluated and examined patient, agree with student's notes, student supervised by me.

## 2014-03-21 ENCOUNTER — Telehealth: Payer: Self-pay | Admitting: Hematology and Oncology

## 2014-03-21 LAB — CBC WITH DIFFERENTIAL/PLATELET
BASOS ABS: 0.1 10*3/uL (ref 0.0–0.1)
BASOS PCT: 1 % (ref 0–1)
EOS ABS: 0.2 10*3/uL (ref 0.0–0.7)
Eosinophils Relative: 2 % (ref 0–5)
HCT: 40.9 % (ref 36.0–46.0)
Hemoglobin: 13.9 g/dL (ref 12.0–15.0)
Lymphocytes Relative: 26 % (ref 12–46)
Lymphs Abs: 3.1 10*3/uL (ref 0.7–4.0)
MCH: 29 pg (ref 26.0–34.0)
MCHC: 34 g/dL (ref 30.0–36.0)
MCV: 85.4 fL (ref 78.0–100.0)
Monocytes Absolute: 0.4 10*3/uL (ref 0.1–1.0)
Monocytes Relative: 3 % (ref 3–12)
NEUTROS ABS: 8.1 10*3/uL — AB (ref 1.7–7.7)
NEUTROS PCT: 68 % (ref 43–77)
Platelets: 601 10*3/uL — ABNORMAL HIGH (ref 150–400)
RBC: 4.79 MIL/uL (ref 3.87–5.11)
RDW: 14 % (ref 11.5–15.5)
WBC: 11.9 10*3/uL — ABNORMAL HIGH (ref 4.0–10.5)

## 2014-03-21 NOTE — Telephone Encounter (Signed)
S/W PATIENT AND GAVE NP APPT FOR 07/23 @ 11 W/DR. [place].  REFERRING DR. Glade Lloyd DX- THROMBOCYTOPENIA

## 2014-04-05 ENCOUNTER — Encounter: Payer: Self-pay | Admitting: Hematology and Oncology

## 2014-04-05 ENCOUNTER — Ambulatory Visit: Payer: Federal, State, Local not specified - PPO

## 2014-04-05 ENCOUNTER — Telehealth: Payer: Self-pay | Admitting: Hematology and Oncology

## 2014-04-05 ENCOUNTER — Ambulatory Visit (HOSPITAL_BASED_OUTPATIENT_CLINIC_OR_DEPARTMENT_OTHER): Payer: Federal, State, Local not specified - PPO | Admitting: Hematology and Oncology

## 2014-04-05 ENCOUNTER — Other Ambulatory Visit (HOSPITAL_BASED_OUTPATIENT_CLINIC_OR_DEPARTMENT_OTHER): Payer: Federal, State, Local not specified - PPO

## 2014-04-05 VITALS — BP 121/73 | HR 81 | Temp 97.7°F | Resp 18 | Ht 65.2 in | Wt 168.2 lb

## 2014-04-05 DIAGNOSIS — F17200 Nicotine dependence, unspecified, uncomplicated: Secondary | ICD-10-CM

## 2014-04-05 DIAGNOSIS — IMO0001 Reserved for inherently not codable concepts without codable children: Secondary | ICD-10-CM

## 2014-04-05 DIAGNOSIS — R209 Unspecified disturbances of skin sensation: Secondary | ICD-10-CM

## 2014-04-05 DIAGNOSIS — D473 Essential (hemorrhagic) thrombocythemia: Secondary | ICD-10-CM

## 2014-04-05 DIAGNOSIS — D75839 Thrombocytosis, unspecified: Secondary | ICD-10-CM

## 2014-04-05 DIAGNOSIS — R61 Generalized hyperhidrosis: Secondary | ICD-10-CM

## 2014-04-05 DIAGNOSIS — Z720 Tobacco use: Secondary | ICD-10-CM | POA: Insufficient documentation

## 2014-04-05 DIAGNOSIS — R202 Paresthesia of skin: Secondary | ICD-10-CM | POA: Insufficient documentation

## 2014-04-05 HISTORY — DX: Thrombocytosis, unspecified: D75.839

## 2014-04-05 LAB — CBC WITH DIFFERENTIAL/PLATELET
BASO%: 1 % (ref 0.0–2.0)
Basophils Absolute: 0.1 10*3/uL (ref 0.0–0.1)
EOS ABS: 0.1 10*3/uL (ref 0.0–0.5)
EOS%: 0.9 % (ref 0.0–7.0)
HCT: 42.9 % (ref 34.8–46.6)
HGB: 13.7 g/dL (ref 11.6–15.9)
LYMPH%: 19.5 % (ref 14.0–49.7)
MCH: 28.7 pg (ref 25.1–34.0)
MCHC: 32.1 g/dL (ref 31.5–36.0)
MCV: 89.4 fL (ref 79.5–101.0)
MONO#: 0.4 10*3/uL (ref 0.1–0.9)
MONO%: 3.7 % (ref 0.0–14.0)
NEUT%: 74.9 % (ref 38.4–76.8)
NEUTROS ABS: 8.3 10*3/uL — AB (ref 1.5–6.5)
PLATELETS: 619 10*3/uL — AB (ref 145–400)
RBC: 4.79 10*6/uL (ref 3.70–5.45)
RDW: 13.4 % (ref 11.2–14.5)
WBC: 11 10*3/uL — AB (ref 3.9–10.3)
lymph#: 2.1 10*3/uL (ref 0.9–3.3)

## 2014-04-05 LAB — IRON AND TIBC CHCC
%SAT: 25 % (ref 21–57)
Iron: 88 ug/dL (ref 41–142)
TIBC: 350 ug/dL (ref 236–444)
UIBC: 263 ug/dL (ref 120–384)

## 2014-04-05 LAB — LACTATE DEHYDROGENASE (CC13): LDH: 160 U/L (ref 125–245)

## 2014-04-05 LAB — SEDIMENTATION RATE: SED RATE: 15 mm/h (ref 0–22)

## 2014-04-05 LAB — FERRITIN CHCC: Ferritin: 47 ng/ml (ref 9–269)

## 2014-04-05 NOTE — Assessment & Plan Note (Signed)
It may be hard to distinguish whether the sweating is due to menopausal symptoms or related to undiagnosed myeloproliferative disorder. I recommend she continues taking Effexor.

## 2014-04-05 NOTE — Progress Notes (Signed)
Checked in new patient with no financial issues prior to seeing the dr. She has appt card and has not been out of the country. °

## 2014-04-05 NOTE — Progress Notes (Signed)
[facility] CONSULT NOTE  Patient Care Team: Denita Lung, MD as PCP - General (Family Medicine)  CHIEF COMPLAINTS/PURPOSE OF CONSULTATION:  Thrombocytosis and leukocytosis with paresthesia  HISTORY OF PRESENTING ILLNESS:  Tamara Espinoza 53 y.o. female is here because of elevated platelet count and WBC.  She was found to have abnormal CBC from blood draw from her primary care physician office that was ordered due to her symptoms. She complained of altered perception/numbness of her hands and feet, worse in her hands. She say that she felt some cramps in the muscles. She was started on Neurontin for his. She denies skin itching. Denies any discoloration of her hands or feet. She have chronic sweating but she attributed to menopausal symptoms since the complete hysterectomy recently. She denies recent infection. The last prescription antibiotics was more than 3 months ago There is not reported symptoms of sinus congestion, cough, urinary frequency/urgency or dysuria, diarrhea or joint swelling/pain. She has chronic eczema but no new abnormal skin rash.  She had no prior history or diagnosis of cancer. Her age appropriate screening programs are up-to-date. The patient has no prior diagnosis of autoimmune disease and was not prescribed corticosteroids related products.  The patient is a smoker and currently smokes 1/2 pack of cigarettes per day for the last 35 years. She denies prior history of thrombosis or blood clot. She denies chest pain, leg cramps or new neurological deficit.  MEDICAL HISTORY:  Past Medical History  Diagnosis Date  . Eczema   . Anemia   . Thrombocytosis 04/05/2014    SURGICAL HISTORY: Past Surgical History  Procedure Laterality Date  . Breast biopsy      bilat  . Cesarean section    . Abdominal hysterectomy      SOCIAL HISTORY: History   Social History  . Marital Status: Married    Spouse Name: N/A    Number of Children: N/A  . Years  of Education: N/A   Occupational History  . Not on file.   Social History Main Topics  . Smoking status: Current Every Day Smoker -- 0.50 packs/day for 35 years    Types: Cigarettes  . Smokeless tobacco: Never Used  . Alcohol Use: 2.0 oz/week    4 drink(s) per week  . Drug Use: No  . Sexual Activity: Not on file   Other Topics Concern  . Not on file   Social History Narrative   Married, 2 children, unemployed currently, living in [place], but coming here for care, exercise calisthenics, walks in warm weather    FAMILY HISTORY: Family History  Problem Relation Age of Onset  . Cancer Mother 62    colon  . Hypertension Mother   . Other Father     suicide  . Heart disease Father     ?  Marland Kitchen Hypertension Sister   . Stroke Neg Hx   . Diabetes Neg Hx   . Cancer Paternal Grandmother     lung ca    ALLERGIES:  is allergic to soap.  MEDICATIONS:  Current Outpatient Prescriptions  Medication Sig Dispense Refill  . gabapentin (NEURONTIN) 300 MG capsule Take 1 capsule (300 mg total) by mouth 3 (three) times daily.  90 capsule  3  . Multiple Vitamins-Minerals (MULTIVITAMIN WITH MINERALS) tablet Take 1 tablet by mouth daily.      Marland Kitchen triamcinolone (KENALOG) 0.1 % cream APPLY TOPICALLY TWICE A DAY  30 g  0  . venlafaxine (EFFEXOR) 37.5 MG tablet Take  1 tablet (37.5 mg total) by mouth 2 (two) times daily with a meal.  60 tablet  5   No current facility-administered medications for this visit.    REVIEW OF SYSTEMS:   Constitutional: Denies fevers, chills or abnormal night sweats Eyes: Denies blurriness of vision, double vision or watery eyes Ears, nose, mouth, throat, and face: Denies mucositis or sore throat Respiratory: Denies cough, dyspnea or wheezes Cardiovascular: Denies palpitation, chest discomfort or lower extremity swelling Gastrointestinal:  Denies nausea, heartburn or change in bowel habits Lymphatics: Denies new lymphadenopathy or easy bruising Neurological:Denies  numbness, tingling or new weaknesses Behavioral/Psych: Mood is stable, no new changes  All other systems were reviewed with the patient and are negative.  PHYSICAL EXAMINATION: ECOG PERFORMANCE STATUS: 1 - Symptomatic but completely ambulatory  Filed Vitals:   04/05/14 1059  BP: 121/73  Pulse: 81  Temp: 97.7 F (36.5 C)  Resp: 18   Filed Weights   04/05/14 1059  Weight: 168 lb 3.2 oz (76.295 kg)    GENERAL:alert, no distress and comfortable SKIN: Noted skin lesions on her shins resemble eczema. EYES: normal, conjunctiva are pink and non-injected, sclera clear OROPHARYNX:no exudate, no erythema and lips, buccal mucosa, and tongue normal  NECK: supple, thyroid normal size, non-tender, without nodularity LYMPH:  no palpable lymphadenopathy in the cervical, axillary or inguinal LUNGS: clear to auscultation and percussion with normal breathing effort HEART: regular rate & rhythm and no murmurs and no lower extremity edema ABDOMEN:abdomen soft, non-tender and normal bowel sounds. No palpable splenomegaly Musculoskeletal:no cyanosis of digits and no clubbing  PSYCH: alert & oriented x 3 with fluent speech NEURO: no focal motor/sensory deficits  LABORATORY DATA:  I have reviewed the data as listed Recent Results (from the past 2160 hour(s))  VITAMIN B12     Status: None   Collection Time    02/12/14 12:57 PM      Result Value Ref Range   Vitamin B-12 426  211 - 911 pg/mL  FOLATE     Status: None   Collection Time    02/12/14 12:57 PM      Result Value Ref Range   Folate >20.0     Comment:       Reference Ranges             Deficient:       0.4 - 3.3 ng/mL             Indeterminate:   3.4 - 5.4 ng/mL             Normal:              > 5.4 ng/mL        HEPATIC FUNCTION PANEL     Status: Abnormal   Collection Time    02/12/14 12:57 PM      Result Value Ref Range   Total Bilirubin 0.6  0.2 - 1.2 mg/dL   Bilirubin, Direct 0.1  0.0 - 0.3 mg/dL   Indirect Bilirubin 0.5  0.2  - 1.2 mg/dL   Alkaline Phosphatase 130 (*) 39 - 117 U/L   AST 18  0 - 37 U/L   ALT 13  0 - 35 U/L   Total Protein 7.7  6.0 - 8.3 g/dL   Albumin 4.5  3.5 - 5.2 g/dL  VITAMIN D 25 HYDROXY     Status: None   Collection Time    02/12/14 12:57 PM      Result Value Ref Range  Vit D, 25-Hydroxy 49  30 - 89 ng/mL   Comment: This assay accurately quantifies Vitamin D, which is the sum of the     25-Hydroxy forms of Vitamin D2 and D3.  Studies have shown that the     optimum concentration of 25-Hydroxy Vitamin D is 30 ng/mL or higher.      Concentrations of Vitamin D between 20 and 29 ng/mL are considered to     be insufficient and concentrations less than 20 ng/mL are considered     to be deficient for Vitamin D.  SEDIMENTATION RATE     Status: None   Collection Time    02/12/14 12:57 PM      Result Value Ref Range   Sed Rate 9  0 - 22 mm/hr  CBC WITH DIFFERENTIAL     Status: Abnormal   Collection Time    03/20/14 12:19 PM      Result Value Ref Range   WBC 11.9 (*) 4.0 - 10.5 K/uL   RBC 4.79  3.87 - 5.11 MIL/uL   Hemoglobin 13.9  12.0 - 15.0 g/dL   HCT 40.9  36.0 - 46.0 %   MCV 85.4  78.0 - 100.0 fL   MCH 29.0  26.0 - 34.0 pg   MCHC 34.0  30.0 - 36.0 g/dL   RDW 14.0  11.5 - 15.5 %   Platelets 601 (*) 150 - 400 K/uL   Neutrophils Relative % 68  43 - 77 %   Neutro Abs 8.1 (*) 1.7 - 7.7 K/uL   Lymphocytes Relative 26  12 - 46 %   Lymphs Abs 3.1  0.7 - 4.0 K/uL   Monocytes Relative 3  3 - 12 %   Monocytes Absolute 0.4  0.1 - 1.0 K/uL   Eosinophils Relative 2  0 - 5 %   Eosinophils Absolute 0.2  0.0 - 0.7 K/uL   Basophils Relative 1  0 - 1 %   Basophils Absolute 0.1  0.0 - 0.1 K/uL   Smear Review Criteria for review not met     ASSESSMENT & PLAN Thrombocytosis Her abnormal blood work and her symptoms are highly suggestive of essential thrombocytosis or other form of myeloproliferative disorders. I will order an additional workup for this. In the meantime, I recommend she quit  smoking and to start aspirin 81 mg daily.  Paresthesias Her symptoms of paresthesias are pathognomonic for myeloproliferative disorder. Her symptoms are improving with gabapentin. I recommend she start aspirin 81 mg daily.  Tobacco abuse I spent some time counseling the patient the importance of tobacco cessation. she is currently attempting to quit on her own  I gave her patient education handout and encouraged her to sign up for smoking cessation class.   Sweating It may be hard to distinguish whether the sweating is due to menopausal symptoms or related to undiagnosed myeloproliferative disorder. I recommend she continues taking Effexor.

## 2014-04-05 NOTE — Assessment & Plan Note (Signed)
Her symptoms of paresthesias are pathognomonic for myeloproliferative disorder. Her symptoms are improving with gabapentin. I recommend she start aspirin 81 mg daily.

## 2014-04-05 NOTE — Assessment & Plan Note (Signed)
I spent some time counseling the patient the importance of tobacco cessation. she is currently attempting to quit on her own  I gave her patient education handout and encouraged her to sign up for smoking cessation class.  

## 2014-04-05 NOTE — Telephone Encounter (Signed)
Pt confirmed labs/ov per 07/23 POF, gave pt AVS.....KJ °

## 2014-04-05 NOTE — Assessment & Plan Note (Signed)
Her abnormal blood work and her symptoms are highly suggestive of essential thrombocytosis or other form of myeloproliferative disorders. I will order an additional workup for this. In the meantime, I recommend she quit smoking and to start aspirin 81 mg daily.

## 2014-04-06 ENCOUNTER — Telehealth: Payer: Self-pay | Admitting: *Deleted

## 2014-04-06 NOTE — Telephone Encounter (Signed)
Opened in error

## 2014-04-06 NOTE — Telephone Encounter (Signed)
Pt called to confirm dose of aspirin recommended by Dr. Alvy Bimler? Instructed pt Dr. Calton Dach note states she instructed pt to take 81 mg Aspirin daily.  She verbalized understanding.

## 2014-04-13 ENCOUNTER — Ambulatory Visit (HOSPITAL_BASED_OUTPATIENT_CLINIC_OR_DEPARTMENT_OTHER): Payer: Federal, State, Local not specified - PPO | Admitting: Hematology and Oncology

## 2014-04-13 ENCOUNTER — Encounter: Payer: Self-pay | Admitting: Hematology and Oncology

## 2014-04-13 VITALS — BP 142/66 | HR 108 | Temp 97.7°F | Resp 20 | Ht 65.2 in | Wt 171.4 lb

## 2014-04-13 DIAGNOSIS — R209 Unspecified disturbances of skin sensation: Secondary | ICD-10-CM

## 2014-04-13 DIAGNOSIS — F17200 Nicotine dependence, unspecified, uncomplicated: Secondary | ICD-10-CM

## 2014-04-13 DIAGNOSIS — D75839 Thrombocytosis, unspecified: Secondary | ICD-10-CM

## 2014-04-13 DIAGNOSIS — D473 Essential (hemorrhagic) thrombocythemia: Secondary | ICD-10-CM

## 2014-04-13 DIAGNOSIS — D72829 Elevated white blood cell count, unspecified: Secondary | ICD-10-CM | POA: Insufficient documentation

## 2014-04-13 DIAGNOSIS — Z720 Tobacco use: Secondary | ICD-10-CM

## 2014-04-13 NOTE — Assessment & Plan Note (Signed)
This could be due to smoking or undiagnosed myeloproliferative disorder. I recommend observation only.

## 2014-04-13 NOTE — Progress Notes (Signed)
[facility] NOTE  Wyatt Haste, MD SUMMARY OF HEMATOLOGIC HISTORY: She was found to have abnormal CBC from blood draw from her primary care physician office that was ordered due to her symptoms. Her platelet count ranges from 600-900,000. She complained of altered perception/numbness of her hands and feet, worse in her hands. She say that she felt some cramps in the muscles. She was started on Neurontin for his. She denies skin itching. Denies any discoloration of her hands or feet. She have chronic sweating but she attributed to menopausal symptoms since the complete hysterectomy recently. In July 2015, peripheral blood for JAK2 mutation, BCR/ABL and MPL mutation were all negative. INTERVAL HISTORY: Tamara Espinoza 53 y.o. female returns for further followup. She continues to have very mild tingling and numbness in the hands. She is attempting to quit smoking. She is taking aspirin. I have reviewed the past medical history, past surgical history, social history and family history with the patient and they are unchanged from previous note.  ALLERGIES:  is allergic to soap.  MEDICATIONS:    Medication List       This list is accurate as of: 04/13/14  3:53 PM.  Always use your most recent med list.               aspirin 81 MG tablet  Take 81 mg by mouth daily.     gabapentin 300 MG capsule  Commonly known as:  NEURONTIN  Take 1 capsule (300 mg total) by mouth 3 (three) times daily.     multivitamin with minerals tablet  Take 1 tablet by mouth daily.     triamcinolone cream 0.1 %  Commonly known as:  KENALOG  APPLY TOPICALLY TWICE A DAY     venlafaxine 37.5 MG tablet  Commonly known as:  EFFEXOR  Take 1 tablet (37.5 mg total) by mouth 2 (two) times daily with a meal.          REVIEW OF SYSTEMS:   All other systems were reviewed with the patient and are negative.  PHYSICAL EXAMINATION: ECOG PERFORMANCE STATUS: 0 -  Asymptomatic  Filed Vitals:   04/13/14 1106  BP: 142/66  Pulse: 108  Temp: 97.7 F (36.5 C)  Resp: 20   Filed Weights   04/13/14 1106  Weight: 171 lb 6.4 oz (77.747 kg)    GENERAL:alert, no distress and comfortable  Musculoskeletal:no cyanosis of digits and no clubbing  NEURO: alert & oriented x 3 with fluent speech, no focal motor/sensory deficits  LABORATORY DATA:  I have reviewed the data as listed No results found for this or any previous visit (from the past 48 hour(s)).  Lab Results  Component Value Date   WBC 11.0* 04/05/2014   HGB 13.7 04/05/2014   HCT 42.9 04/05/2014   MCV 89.4 04/05/2014   PLT 619* 04/05/2014   ASSESSMENT & PLAN:  Thrombocytosis All the workup for myeloproliferative disorder came back negative. This would still not exclude possible diagnosis of essential thrombocytosis. I discussed with the patient and her husband I am not keen to perform bone marrow aspirate and biopsy right now. In the absence of history of thrombosis, she would not need to be started on chemotherapy. I recommend she continue on aspirin 81 mg daily.  Tobacco abuse I spent some time counseling the patient the importance of tobacco cessation. she is currently attempting to quit on her own  I gave her patient education handout and encouraged her to sign up  for smoking cessation class.   Leukocytosis, unspecified This could be due to smoking or undiagnosed myeloproliferative disorder. I recommend observation only.    All questions were answered. The patient knows to call the clinic with any problems, questions or concerns. No barriers to learning was detected.  I spent 15 minutes counseling the patient face to face. The total time spent in the appointment was 20 minutes and more than 50% was on counseling.     [facility], [place], MD 04/13/2014 3:51 PM

## 2014-04-13 NOTE — Assessment & Plan Note (Signed)
All the workup for myeloproliferative disorder came back negative. This would still not exclude possible diagnosis of essential thrombocytosis. I discussed with the patient and her husband I am not keen to perform bone marrow aspirate and biopsy right now. In the absence of history of thrombosis, she would not need to be started on chemotherapy. I recommend she continue on aspirin 81 mg daily. 

## 2014-04-13 NOTE — Assessment & Plan Note (Signed)
I spent some time counseling the patient the importance of tobacco cessation. she is currently attempting to quit on her own  I gave her patient education handout and encouraged her to sign up for smoking cessation class.  

## 2014-04-16 ENCOUNTER — Telehealth: Payer: Self-pay | Admitting: Hematology and Oncology

## 2014-04-16 NOTE — Telephone Encounter (Signed)
s.w.l pt and advised on Feb 2016 appt....pt ok and aware

## 2014-06-24 ENCOUNTER — Other Ambulatory Visit: Payer: Self-pay | Admitting: Medical

## 2014-08-13 ENCOUNTER — Encounter: Payer: Self-pay | Admitting: Internal Medicine

## 2014-08-13 ENCOUNTER — Ambulatory Visit (INDEPENDENT_AMBULATORY_CARE_PROVIDER_SITE_OTHER): Payer: Federal, State, Local not specified - PPO | Admitting: Internal Medicine

## 2014-08-13 VITALS — BP 116/68 | HR 89 | Temp 98.2°F | Wt 180.0 lb

## 2014-08-13 DIAGNOSIS — R202 Paresthesia of skin: Secondary | ICD-10-CM

## 2014-08-13 DIAGNOSIS — Z780 Asymptomatic menopausal state: Secondary | ICD-10-CM | POA: Insufficient documentation

## 2014-08-13 DIAGNOSIS — L309 Dermatitis, unspecified: Secondary | ICD-10-CM

## 2014-08-13 DIAGNOSIS — F32A Depression, unspecified: Secondary | ICD-10-CM

## 2014-08-13 DIAGNOSIS — D75839 Thrombocytosis, unspecified: Secondary | ICD-10-CM

## 2014-08-13 DIAGNOSIS — D473 Essential (hemorrhagic) thrombocythemia: Secondary | ICD-10-CM

## 2014-08-13 DIAGNOSIS — F17200 Nicotine dependence, unspecified, uncomplicated: Secondary | ICD-10-CM

## 2014-08-13 DIAGNOSIS — F329 Major depressive disorder, single episode, unspecified: Secondary | ICD-10-CM | POA: Insufficient documentation

## 2014-08-13 DIAGNOSIS — Z1211 Encounter for screening for malignant neoplasm of colon: Secondary | ICD-10-CM

## 2014-08-13 DIAGNOSIS — Z13220 Encounter for screening for lipoid disorders: Secondary | ICD-10-CM

## 2014-08-13 DIAGNOSIS — Z720 Tobacco use: Secondary | ICD-10-CM

## 2014-08-13 LAB — CBC WITH DIFFERENTIAL/PLATELET
BASOS PCT: 0.8 % (ref 0.0–3.0)
Basophils Absolute: 0.1 10*3/uL (ref 0.0–0.1)
EOS ABS: 0.2 10*3/uL (ref 0.0–0.7)
EOS PCT: 1.5 % (ref 0.0–5.0)
HEMATOCRIT: 40.7 % (ref 36.0–46.0)
HEMOGLOBIN: 13 g/dL (ref 12.0–15.0)
LYMPHS ABS: 2.6 10*3/uL (ref 0.7–4.0)
Lymphocytes Relative: 23.6 % (ref 12.0–46.0)
MCHC: 32 g/dL (ref 30.0–36.0)
MCV: 89 fl (ref 78.0–100.0)
MONO ABS: 0.3 10*3/uL (ref 0.1–1.0)
Monocytes Relative: 2.6 % — ABNORMAL LOW (ref 3.0–12.0)
NEUTROS ABS: 7.9 10*3/uL — AB (ref 1.4–7.7)
Neutrophils Relative %: 71.5 % (ref 43.0–77.0)
Platelets: 655 10*3/uL — ABNORMAL HIGH (ref 150.0–400.0)
RBC: 4.57 Mil/uL (ref 3.87–5.11)
RDW: 13.7 % (ref 11.5–15.5)
WBC: 11.1 10*3/uL — AB (ref 4.0–10.5)

## 2014-08-13 LAB — COMPREHENSIVE METABOLIC PANEL
ALBUMIN: 3.8 g/dL (ref 3.5–5.2)
ALK PHOS: 129 U/L — AB (ref 39–117)
ALT: 21 U/L (ref 0–35)
AST: 21 U/L (ref 0–37)
BILIRUBIN TOTAL: 0.4 mg/dL (ref 0.2–1.2)
BUN: 11 mg/dL (ref 6–23)
CO2: 24 mEq/L (ref 19–32)
CREATININE: 0.7 mg/dL (ref 0.4–1.2)
Calcium: 9.2 mg/dL (ref 8.4–10.5)
Chloride: 104 mEq/L (ref 96–112)
GFR: 109.61 mL/min (ref >60.00)
GLUCOSE: 109 mg/dL — AB (ref 70–99)
POTASSIUM: 3.6 meq/L (ref 3.5–5.1)
Sodium: 137 mEq/L (ref 135–145)
Total Protein: 7.3 g/dL (ref 6.0–8.3)

## 2014-08-13 LAB — LIPID PANEL
CHOL/HDL RATIO: 8
CHOLESTEROL: 273 mg/dL — AB (ref 0–200)
HDL: 35.9 mg/dL — AB (ref >39.00)
NONHDL: 237.1
TRIGLYCERIDES: 257 mg/dL — AB (ref 0.0–149.0)
VLDL: 51.4 mg/dL — ABNORMAL HIGH (ref 0.0–40.0)

## 2014-08-13 LAB — LDL CHOLESTEROL, DIRECT: Direct LDL: 191.1 mg/dL

## 2014-08-13 MED ORDER — TRIAMCINOLONE ACETONIDE 0.1 % EX CREA: TOPICAL_CREAM | Freq: Two times a day (BID) | CUTANEOUS | Status: DC

## 2014-08-13 MED ORDER — GABAPENTIN 300 MG PO CAPS
ORAL_CAPSULE | ORAL | Status: DC
Start: 2014-08-13 — End: 2015-06-17

## 2014-08-13 MED ORDER — BUPROPION HCL ER (SR) 150 MG PO TB12: 150.0000 mg | ORAL_TABLET | Freq: Two times a day (BID) | ORAL | Status: DC

## 2014-08-13 MED ORDER — VENLAFAXINE HCL 37.5 MG PO TABS: 37.5000 mg | ORAL_TABLET | Freq: Two times a day (BID) | ORAL | Status: DC

## 2014-08-13 NOTE — Assessment & Plan Note (Signed)
Mild due to stress Has strong support system (including husband who accompanied her here today) No RX needed at this time

## 2014-08-13 NOTE — Assessment & Plan Note (Signed)
Follows with neurology Has upcoming appt Neurontin refilled today

## 2014-08-13 NOTE — Assessment & Plan Note (Signed)
Well controlled on Effexor Medication refilled today

## 2014-08-13 NOTE — Assessment & Plan Note (Signed)
Currently stable Triamcinolone cream refilled today

## 2014-08-13 NOTE — Progress Notes (Signed)
Pre visit review using our clinic review tool, if applicable. No additional management support is needed unless otherwise documented below in the visit note. 

## 2014-08-13 NOTE — Assessment & Plan Note (Signed)
Motivated Discussed different prescription treatment options Given family history of SI with her current depression, we both agreed that Chantix would not be the best option eRx for Wellbutrin Handout given on smoking cessation tips for success

## 2014-08-13 NOTE — Patient Instructions (Signed)
Smoking Cessation, Tips for Success  If you are ready to quit smoking, congratulations! You have chosen to help yourself be healthier. Cigarettes bring nicotine, tar, carbon monoxide, and other irritants into your body. Your lungs, heart, and blood vessels will be able to work better without these poisons. There are many different ways to quit smoking. Nicotine gum, nicotine patches, a nicotine inhaler, or nicotine nasal spray can help with physical craving. Hypnosis, support groups, and medicines help break the habit of smoking.  WHAT THINGS CAN I DO TO MAKE QUITTING EASIER?   Here are some tips to help you quit for good:  · Pick a date when you will quit smoking completely. Tell all of your friends and family about your plan to quit on that date.  · Do not try to slowly cut down on the number of cigarettes you are smoking. Pick a quit date and quit smoking completely starting on that day.  · Throw away all cigarettes.    · Clean and remove all ashtrays from your home, work, and car.  · On a card, write down your reasons for quitting. Carry the card with you and read it when you get the urge to smoke.  · Cleanse your body of nicotine. Drink enough water and fluids to keep your urine clear or pale yellow. Do this after quitting to flush the nicotine from your body.  · Learn to predict your moods. Do not let a bad situation be your excuse to have a cigarette. Some situations in your life might tempt you into wanting a cigarette.  · Never have "just one" cigarette. It leads to wanting another and another. Remind yourself of your decision to quit.  · Change habits associated with smoking. If you smoked while driving or when feeling stressed, try other activities to replace smoking. Stand up when drinking your coffee. Brush your teeth after eating. Sit in a different chair when you read the paper. Avoid alcohol while trying to quit, and try to drink fewer caffeinated beverages. Alcohol and caffeine may urge you to  smoke.  · Avoid foods and drinks that can trigger a desire to smoke, such as sugary or spicy foods and alcohol.  · Ask people who smoke not to smoke around you.  · Have something planned to do right after eating or having a cup of coffee. For example, plan to take a walk or exercise.  · Try a relaxation exercise to calm you down and decrease your stress. Remember, you may be tense and nervous for the first 2 weeks after you quit, but this will pass.  · Find new activities to keep your hands busy. Play with a pen, coin, or rubber band. Doodle or draw things on paper.  · Brush your teeth right after eating. This will help cut down on the craving for the taste of tobacco after meals. You can also try mouthwash.    · Use oral substitutes in place of cigarettes. Try using lemon drops, carrots, cinnamon sticks, or chewing gum. Keep them handy so they are available when you have the urge to smoke.  · When you have the urge to smoke, try deep breathing.  · Designate your home as a nonsmoking area.  · If you are a heavy smoker, ask your health care provider about a prescription for nicotine chewing gum. It can ease your withdrawal from nicotine.  · Reward yourself. Set aside the cigarette money you save and buy yourself something nice.  · Look for   support from others. Join a support group or smoking cessation program. Ask someone at home or at work to help you with your plan to quit smoking.  · Always ask yourself, "Do I need this cigarette or is this just a reflex?" Tell yourself, "Today, I choose not to smoke," or "I do not want to smoke." You are reminding yourself of your decision to quit.  · Do not replace cigarette smoking with electronic cigarettes (commonly called e-cigarettes). The safety of e-cigarettes is unknown, and some may contain harmful chemicals.  · If you relapse, do not give up! Plan ahead and think about what you will do the next time you get the urge to smoke.  HOW WILL I FEEL WHEN I QUIT SMOKING?  You  may have symptoms of withdrawal because your body is used to nicotine (the addictive substance in cigarettes). You may crave cigarettes, be irritable, feel very hungry, cough often, get headaches, or have difficulty concentrating. The withdrawal symptoms are only temporary. They are strongest when you first quit but will go away within 10-14 days. When withdrawal symptoms occur, stay in control. Think about your reasons for quitting. Remind yourself that these are signs that your body is healing and getting used to being without cigarettes. Remember that withdrawal symptoms are easier to treat than the major diseases that smoking can cause.   Even after the withdrawal is over, expect periodic urges to smoke. However, these cravings are generally short lived and will go away whether you smoke or not. Do not smoke!  WHAT RESOURCES ARE AVAILABLE TO HELP ME QUIT SMOKING?  Your health care provider can direct you to community resources or hospitals for support, which may include:  · Group support.  · Education.  · Hypnosis.  · Therapy.  Document Released: 05/29/2004 Document Revised: 01/15/2014 Document Reviewed: 02/16/2013  [employer]® Patient Information ©2015 [employer]. This information is not intended to replace advice given to you by your health care provider. Make sure you discuss any questions you have with your health care provider.

## 2014-08-13 NOTE — Progress Notes (Signed)
HPI  Pt presents to the clinic today to establish care. She is transferring care from Dr. Glade Lloyd.    Paresthesias: BUE. Idiopathic. Has had nerve conduction studies done by neurology. Symptoms improved with Neurontin.  Eczema: uses triamcinolone cream as needed. Well controlled as long as she doesn't use certain kinds of soap or takes a shower with hot water.  Smoker: She is ready to quit. She has tried the gum OTC without any noted effect. She would like to know what prescription options are available.   Depression: mild, due to stress. She moved to the area in January. She is currently looking for employment which adds to her stress. She denies SI/HI.  Menopause: S/P total hysterectomy. Symptoms well controlled on effexor.  Anemia and Thrombocytopenia: Idiopathic. No signs of bleeding that she can see. Follows with hematology.  Flu: 05/2014 Tetanus: 2014 LMP: Hysterectomy 2014 (total- now on effexor) Mammogram: 09/2013- normal Colon Screening: never Vision Screening: yearly Dentist: as needed   Past Medical History  Diagnosis Date  . Eczema   . Anemia   . Thrombocytosis 04/05/2014  . Chicken pox     Current Outpatient Prescriptions  Medication Sig Dispense Refill  . aspirin 81 MG tablet Take 81 mg by mouth daily.    Marland Kitchen gabapentin (NEURONTIN) 300 MG capsule TAKE 1 CAPSULE (300 MG TOTAL) BY MOUTH 3 (THREE) TIMES DAILY. 90 capsule 3  . Multiple Vitamins-Minerals (MULTIVITAMIN WITH MINERALS) tablet Take 1 tablet by mouth daily.    Marland Kitchen triamcinolone (KENALOG) 0.1 % cream APPLY TOPICALLY TWICE A DAY 30 g 0  . venlafaxine (EFFEXOR) 37.5 MG tablet Take 1 tablet (37.5 mg total) by mouth 2 (two) times daily with a meal. 60 tablet 5   No current facility-administered medications for this visit.    Allergies  Allergen Reactions  . Soap     Oil of olay body wash    Family History  Problem Relation Age of Onset  . Cancer Mother 74    colon  . Hypertension Mother   . Other  Father     suicide  . Heart disease Father     ?  Marland Kitchen Hypertension Sister   . Stroke Neg Hx   . Diabetes Neg Hx   . Cancer Paternal Grandmother     lung ca    History   Social History  . Marital Status: Married    Spouse Name: N/A    Number of Children: N/A  . Years of Education: N/A   Occupational History  . Not on file.   Social History Main Topics  . Smoking status: Current Every Day Smoker -- 0.50 packs/day for 35 years    Types: Cigarettes  . Smokeless tobacco: Never Used  . Alcohol Use: 2.4 oz/week    4 Not specified per week     Comment: occasional  . Drug Use: No  . Sexual Activity: Not on file   Other Topics Concern  . Not on file   Social History Narrative   Married, 2 children, unemployed currently, living in [place], but coming here for care, exercise calisthenics, walks in warm weather    ROS:  Constitutional: Denies fever, malaise, fatigue, headache or abrupt weight changes.  Respiratory: Denies difficulty breathing, shortness of breath, cough or sputum production.   Cardiovascular: Denies chest pain, chest tightness, palpitations or swelling in the hands or feet.  Gastrointestinal: Denies abdominal pain, bloating, constipation, diarrhea or blood in the stool.  Skin: Denies redness, rashes, lesions or ulcercations.  Neurological: Denies dizziness, difficulty with memory, difficulty with speech or problems with balance and coordination.  Psych: Pt reports depression. Denies anxiety, SI/HI.  No other specific complaints in a complete review of systems (except as listed in HPI above).  PE:  BP 116/68 mmHg  Pulse 89  Temp(Src) 98.2 F (36.8 C) (Oral)  Wt 180 lb (81.647 kg)  SpO2 98% Wt Readings from Last 3 Encounters:  08/13/14 180 lb (81.647 kg)  04/13/14 171 lb 6.4 oz (77.747 kg)  04/05/14 168 lb 3.2 oz (76.295 kg)    General: Appears her stated age, well developed, well nourished in NAD. Cardiovascular: Normal rate and rhythm. S1,S2 noted.   No murmur, rubs or gallops noted. No JVD or BLE edema. No carotid bruits noted. Pulmonary/Chest: Normal effort and positive vesicular breath sounds. No respiratory distress. No wheezes, rales or ronchi noted.  Neurological: Alert and oriented.  Psychiatric: Mood and affect normal. Behavior is normal. Judgment and thought content normal.     BMET    Component Value Date/Time   NA 139 09/27/2012 1057   K 4.4 09/27/2012 1057   CL 109 09/27/2012 1057   CO2 21 09/27/2012 1057   GLUCOSE 81 09/27/2012 1057   BUN 10 09/27/2012 1057   CREATININE 0.50 09/27/2012 1057   CALCIUM 9.4 09/27/2012 1057    Lipid Panel     Component Value Date/Time   CHOL 206* 09/27/2012 1057   TRIG 128 09/27/2012 1057   HDL 36* 09/27/2012 1057   CHOLHDL 5.7 09/27/2012 1057   VLDL 26 09/27/2012 1057   LDLCALC 144* 09/27/2012 1057    CBC    Component Value Date/Time   WBC 11.0* 04/05/2014 1135   WBC 11.9* 03/20/2014 1219   RBC 4.79 04/05/2014 1135   RBC 4.79 03/20/2014 1219   HGB 13.7 04/05/2014 1135   HGB 13.9 03/20/2014 1219   HCT 42.9 04/05/2014 1135   HCT 40.9 03/20/2014 1219   PLT 619* 04/05/2014 1135   PLT 601* 03/20/2014 1219   MCV 89.4 04/05/2014 1135   MCV 85.4 03/20/2014 1219   MCH 28.7 04/05/2014 1135   MCH 29.0 03/20/2014 1219   MCHC 32.1 04/05/2014 1135   MCHC 34.0 03/20/2014 1219   RDW 13.4 04/05/2014 1135   RDW 14.0 03/20/2014 1219   LYMPHSABS 2.1 04/05/2014 1135   LYMPHSABS 3.1 03/20/2014 1219   MONOABS 0.4 04/05/2014 1135   MONOABS 0.4 03/20/2014 1219   EOSABS 0.1 04/05/2014 1135   EOSABS 0.2 03/20/2014 1219   BASOSABS 0.1 04/05/2014 1135   BASOSABS 0.1 03/20/2014 1219    Hgb A1C No results found for: HGBA1C   Assessment and Plan:  Preventative Health:  Given family hx of colon cancer, will refer to GI for screening colonoscopy Will check CBC, CMET and Lipid profile today  RTC in 1 year or sooner if needed

## 2014-08-13 NOTE — Assessment & Plan Note (Signed)
Continue to follow with hematology No ASA or NSAIDS

## 2014-08-14 ENCOUNTER — Telehealth: Payer: Self-pay | Admitting: Internal Medicine

## 2014-08-14 NOTE — Telephone Encounter (Signed)
emmi mailed  °

## 2014-08-15 ENCOUNTER — Other Ambulatory Visit: Payer: Self-pay | Admitting: Internal Medicine

## 2014-08-15 MED ORDER — ATORVASTATIN CALCIUM 20 MG PO TABS: 20.0000 mg | ORAL_TABLET | Freq: Every day | ORAL | Status: DC

## 2014-08-29 NOTE — Addendum Note (Signed)
Addended by: Lurlean Nanny on: 08/29/2014 09:02 AM   Modules accepted: Orders

## 2014-09-09 ENCOUNTER — Other Ambulatory Visit: Payer: Self-pay | Admitting: Medical

## 2014-09-10 ENCOUNTER — Encounter: Payer: Self-pay | Admitting: Gastroenterology

## 2014-09-15 ENCOUNTER — Other Ambulatory Visit: Payer: Self-pay | Admitting: Medical

## 2014-09-21 ENCOUNTER — Encounter: Payer: Self-pay | Admitting: Internal Medicine

## 2014-09-26 ENCOUNTER — Other Ambulatory Visit: Payer: Self-pay

## 2014-09-26 DIAGNOSIS — Z1231 Encounter for screening mammogram for malignant neoplasm of breast: Secondary | ICD-10-CM

## 2014-10-17 ENCOUNTER — Encounter: Payer: Self-pay | Admitting: Hematology and Oncology

## 2014-10-17 ENCOUNTER — Ambulatory Visit (HOSPITAL_BASED_OUTPATIENT_CLINIC_OR_DEPARTMENT_OTHER): Payer: PRIVATE HEALTH INSURANCE | Admitting: Hematology and Oncology

## 2014-10-17 ENCOUNTER — Telehealth: Payer: Self-pay | Admitting: Hematology and Oncology

## 2014-10-17 ENCOUNTER — Other Ambulatory Visit (HOSPITAL_BASED_OUTPATIENT_CLINIC_OR_DEPARTMENT_OTHER): Payer: PRIVATE HEALTH INSURANCE

## 2014-10-17 VITALS — BP 133/94 | HR 67 | Temp 98.0°F | Resp 19 | Ht 65.0 in | Wt 185.0 lb

## 2014-10-17 DIAGNOSIS — F17200 Nicotine dependence, unspecified, uncomplicated: Secondary | ICD-10-CM

## 2014-10-17 DIAGNOSIS — D75839 Thrombocytosis, unspecified: Secondary | ICD-10-CM

## 2014-10-17 DIAGNOSIS — Z720 Tobacco use: Secondary | ICD-10-CM

## 2014-10-17 DIAGNOSIS — D473 Essential (hemorrhagic) thrombocythemia: Secondary | ICD-10-CM

## 2014-10-17 LAB — CBC WITH DIFFERENTIAL/PLATELET
BASO%: 0.7 % (ref 0.0–2.0)
BASOS ABS: 0.1 10*3/uL (ref 0.0–0.1)
EOS ABS: 0.2 10*3/uL (ref 0.0–0.5)
EOS%: 2.2 % (ref 0.0–7.0)
HEMATOCRIT: 38.9 % (ref 34.8–46.6)
HGB: 12.6 g/dL (ref 11.6–15.9)
LYMPH#: 3.1 10*3/uL (ref 0.9–3.3)
LYMPH%: 30.6 % (ref 14.0–49.7)
MCH: 28.4 pg (ref 25.1–34.0)
MCHC: 32.4 g/dL (ref 31.5–36.0)
MCV: 87.8 fL (ref 79.5–101.0)
MONO#: 0.5 10*3/uL (ref 0.1–0.9)
MONO%: 5.2 % (ref 0.0–14.0)
NEUT#: 6.2 10*3/uL (ref 1.5–6.5)
NEUT%: 61.3 % (ref 38.4–76.8)
PLATELETS: 511 10*3/uL — AB (ref 145–400)
RBC: 4.43 10*6/uL (ref 3.70–5.45)
RDW: 13.7 % (ref 11.2–14.5)
WBC: 10.1 10*3/uL (ref 3.9–10.3)

## 2014-10-17 NOTE — Telephone Encounter (Signed)
Confirm appointment for October. Mailed calendar.

## 2014-10-18 ENCOUNTER — Encounter: Payer: Self-pay | Admitting: Hematology and Oncology

## 2014-10-18 ENCOUNTER — Ambulatory Visit (AMBULATORY_SURGERY_CENTER): Payer: Self-pay | Admitting: *Deleted

## 2014-10-18 VITALS — Ht 67.0 in | Wt 184.2 lb

## 2014-10-18 DIAGNOSIS — Z800 Family history of malignant neoplasm of digestive organs: Secondary | ICD-10-CM

## 2014-10-18 MED ORDER — NA SULFATE-K SULFATE-MG SULF 17.5-3.13-1.6 GM/177ML PO SOLN: 1.0000 | Freq: Once | ORAL | Status: DC

## 2014-10-18 NOTE — Progress Notes (Signed)
No egg or soy allergy No issues with past sedation No diet pills No home 02 use emmi video to e mail pts husband at her side during Pv today

## 2014-10-18 NOTE — Assessment & Plan Note (Signed)
All the workup for myeloproliferative disorder came back negative. This would still not exclude possible diagnosis of essential thrombocytosis. I discussed with the patient and her husband I am not keen to perform bone marrow aspirate and biopsy right now. In the absence of history of thrombosis, she would not need to be started on chemotherapy. I recommend she continue on aspirin 81 mg daily.

## 2014-10-18 NOTE — Assessment & Plan Note (Signed)
I spent some time counseling the patient the importance of tobacco cessation. she is currently attempting to quit on her own  I gave her patient education handout and encouraged her to sign up for smoking cessation class. We have set a quit date for her to quit smoking this year around her birthday

## 2014-10-18 NOTE — Progress Notes (Signed)
[place] OFFICE PROGRESS NOTE  BAITY, REGINA, NP SUMMARY OF HEMATOLOGIC HISTORY:  She was found to have abnormal CBC from blood draw from her primary care physician office that was ordered due to her symptoms. Her platelet count ranges from 600-900,000. She complained of altered perception/numbness of her hands and feet, worse in her hands. She say that she felt some cramps in the muscles. She was started on Neurontin for his. She denies skin itching. Denies any discoloration of her hands or feet. She have chronic sweating but she attributed to menopausal symptoms since the complete hysterectomy recently. In July 2015, peripheral blood for JAK2 mutation, BCR/ABL and MPL mutation were all negative. She is placed on aspirin therapy and observation INTERVAL HISTORY: [place] 54 y.o. female returns for further follow-up. She is attempting to quit smoking. She is only smoking about 5 cigarettes per day. She denies recent infection.  No recent bleeding. I have reviewed the past medical history, past surgical history, social history and family history with the patient and they are unchanged from previous note.  ALLERGIES:  has No Known Allergies.  MEDICATIONS:  Current Outpatient Prescriptions  Medication Sig Dispense Refill  . aspirin 81 MG tablet Take 81 mg by mouth daily.    Marland Kitchen atorvastatin (LIPITOR) 20 MG tablet Take 1 tablet (20 mg total) by mouth daily. 30 tablet 2  . buPROPion (WELLBUTRIN SR) 150 MG 12 hr tablet Take 1 tablet (150 mg total) by mouth 2 (two) times daily. 60 tablet 0  . gabapentin (NEURONTIN) 300 MG capsule TAKE 1 CAPSULE (300 MG TOTAL) BY MOUTH 3 (THREE) TIMES DAILY. 90 capsule 3  . Multiple Vitamins-Minerals (MULTIVITAMIN WITH MINERALS) tablet Take 1 tablet by mouth daily.    Marland Kitchen triamcinolone cream (KENALOG) 0.1 % Apply topically 2 (two) times daily. 30 g 2  . venlafaxine (EFFEXOR) 37.5 MG tablet Take 1 tablet (37.5 mg total) by mouth 2 (two) times  daily with a meal. 60 tablet 11  . Na Sulfate-K Sulfate-Mg Sulf (SUPREP BOWEL PREP) SOLN Take 1 kit by mouth once. suprep as directed. No substitutions 354 mL 0   No current facility-administered medications for this visit.     REVIEW OF SYSTEMS:   Constitutional: Denies fevers, chills or night sweats Eyes: Denies blurriness of vision Ears, nose, mouth, throat, and face: Denies mucositis or sore throat Respiratory: Denies cough, dyspnea or wheezes Cardiovascular: Denies palpitation, chest discomfort or lower extremity swelling Gastrointestinal:  Denies nausea, heartburn or change in bowel habits Skin: Denies abnormal skin rashes Lymphatics: Denies new lymphadenopathy or easy bruising Neurological:Denies numbness, tingling or new weaknesses Behavioral/Psych: Mood is stable, no new changes  All other systems were reviewed with the patient and are negative.  PHYSICAL EXAMINATION: ECOG PERFORMANCE STATUS: 0 - Asymptomatic  Filed Vitals:   10/17/14 1149  BP: 133/94  Pulse: 67  Temp: 98 F (36.7 C)  Resp: 19   Filed Weights   10/17/14 1149  Weight: 185 lb (83.915 kg)    GENERAL:alert, no distress and comfortable SKIN: skin color, texture, turgor are normal, no rashes or significant lesions EYES: normal, Conjunctiva are pink and non-injected, sclera clear OROPHARYNX:no exudate, no erythema and lips, buccal mucosa, and tongue normal  NECK: supple, thyroid normal size, non-tender, without nodularity LYMPH:  no palpable lymphadenopathy in the cervical, axillary or inguinal LUNGS: clear to auscultation and percussion with normal breathing effort HEART: regular rate & rhythm and no murmurs and no lower extremity edema ABDOMEN:abdomen soft, non-tender  and normal bowel sounds Musculoskeletal:no cyanosis of digits and no clubbing  NEURO: alert & oriented x 3 with fluent speech, no focal motor/sensory deficits  LABORATORY DATA:  I have reviewed the data as listed Results for orders  placed or performed in visit on 10/17/14 (from the past 48 hour(s))  CBC with Differential     Status: Abnormal   Collection Time: 10/17/14 11:35 AM  Result Value Ref Range   WBC 10.1 3.9 - 10.3 10e3/uL   NEUT# 6.2 1.5 - 6.5 10e3/uL   HGB 12.6 11.6 - 15.9 g/dL   HCT 38.9 34.8 - 46.6 %   Platelets 511 (H) 145 - 400 10e3/uL   MCV 87.8 79.5 - 101.0 fL   MCH 28.4 25.1 - 34.0 pg   MCHC 32.4 31.5 - 36.0 g/dL   RBC 4.43 3.70 - 5.45 10e6/uL   RDW 13.7 11.2 - 14.5 %   lymph# 3.1 0.9 - 3.3 10e3/uL   MONO# 0.5 0.1 - 0.9 10e3/uL   Eosinophils Absolute 0.2 0.0 - 0.5 10e3/uL   Basophils Absolute 0.1 0.0 - 0.1 10e3/uL   NEUT% 61.3 38.4 - 76.8 %   LYMPH% 30.6 14.0 - 49.7 %   MONO% 5.2 0.0 - 14.0 %   EOS% 2.2 0.0 - 7.0 %   BASO% 0.7 0.0 - 2.0 %    Lab Results  Component Value Date   WBC 10.1 10/17/2014   HGB 12.6 10/17/2014   HCT 38.9 10/17/2014   MCV 87.8 10/17/2014   PLT 511* 10/17/2014   ASSESSMENT & PLAN:  Thrombocytosis All the workup for myeloproliferative disorder came back negative. This would still not exclude possible diagnosis of essential thrombocytosis. I discussed with the patient and her husband I am not keen to perform bone marrow aspirate and biopsy right now. In the absence of history of thrombosis, she would not need to be started on chemotherapy. I recommend she continue on aspirin 81 mg daily.   Current smoker I spent some time counseling the patient the importance of tobacco cessation. she is currently attempting to quit on her own  I gave her patient education handout and encouraged her to sign up for smoking cessation class. We have set a quit date for her to quit smoking this year around her birthday       All questions were answered. The patient knows to call the clinic with any problems, questions or concerns. No barriers to learning was detected.  I spent 15 minutes counseling the patient face to face. The total time spent in the appointment was 20  minutes and more than 50% was on counseling.     [facility], Icel Castles, MD 2/4/20169:07 AM

## 2014-10-22 ENCOUNTER — Ambulatory Visit
Admission: RE | Admit: 2014-10-22 | Discharge: 2014-10-22 | Disposition: A | Discharge status: Home / Self Care | Payer: PRIVATE HEALTH INSURANCE | Source: Ambulatory Visit

## 2014-10-22 DIAGNOSIS — Z1231 Encounter for screening mammogram for malignant neoplasm of breast: Secondary | ICD-10-CM

## 2014-10-23 ENCOUNTER — Encounter: Payer: Self-pay | Admitting: Internal Medicine

## 2014-10-23 ENCOUNTER — Ambulatory Visit (INDEPENDENT_AMBULATORY_CARE_PROVIDER_SITE_OTHER): Payer: PRIVATE HEALTH INSURANCE | Admitting: Internal Medicine

## 2014-10-23 VITALS — BP 116/68 | HR 73 | Temp 97.9°F | Wt 183.0 lb

## 2014-10-23 DIAGNOSIS — Z13220 Encounter for screening for lipoid disorders: Secondary | ICD-10-CM

## 2014-10-23 DIAGNOSIS — T700XXA Otitic barotrauma, initial encounter: Secondary | ICD-10-CM

## 2014-10-23 DIAGNOSIS — R0982 Postnasal drip: Secondary | ICD-10-CM

## 2014-10-23 DIAGNOSIS — H6982 Other specified disorders of Eustachian tube, left ear: Secondary | ICD-10-CM

## 2014-10-23 LAB — COMPREHENSIVE METABOLIC PANEL
ALT: 21 U/L (ref 0–35)
AST: 23 U/L (ref 0–37)
Albumin: 4.1 g/dL (ref 3.5–5.2)
Alkaline Phosphatase: 126 U/L — ABNORMAL HIGH (ref 39–117)
BILIRUBIN TOTAL: 0.4 mg/dL (ref 0.2–1.2)
BUN: 12 mg/dL (ref 6–23)
CALCIUM: 9.9 mg/dL (ref 8.4–10.5)
CO2: 28 mEq/L (ref 19–32)
CREATININE: 0.57 mg/dL (ref 0.40–1.20)
Chloride: 107 mEq/L (ref 96–112)
GFR: 143.41 mL/min (ref >60.00)
Glucose, Bld: 69 mg/dL — ABNORMAL LOW (ref 70–99)
Potassium: 4 mEq/L (ref 3.5–5.1)
SODIUM: 139 meq/L (ref 135–145)
Total Protein: 7.5 g/dL (ref 6.0–8.3)

## 2014-10-23 LAB — LIPID PANEL
CHOL/HDL RATIO: 4
Cholesterol: 184 mg/dL (ref 0–200)
HDL: 44.6 mg/dL (ref >39.00)
LDL CALC: 118 mg/dL — AB (ref 0–99)
NonHDL: 139.4
Triglycerides: 109 mg/dL (ref 0.0–149.0)
VLDL: 21.8 mg/dL (ref 0.0–40.0)

## 2014-10-23 NOTE — Progress Notes (Signed)
Pre visit review using our clinic review tool, if applicable. No additional management support is needed unless otherwise documented below in the visit note. 

## 2014-10-23 NOTE — Addendum Note (Signed)
Addended by: Ellamae Sia on: 10/23/2014 10:39 AM   Modules accepted: Orders

## 2014-10-23 NOTE — Progress Notes (Signed)
HPI  Tamara Espinoza is a 54 y.o. female presenting for c/o persistent left ear pain and nausea x 1 week. Mild pain in right ear is also felt but left is more much more painful. No dizziness or visual disturbances. No ringing in the ears or trauma to the head. Hx of ear infections and post nasal drip. She has not tried anything OTC. She has not had sick contacts.  Review of Systems      Past Medical History  Diagnosis Date  . Eczema   . Anemia   . Thrombocytosis 04/05/2014  . Chicken pox   . Mood changes     Family History  Problem Relation Age of Onset  . Cancer Mother 68    colon  . Hypertension Mother   . Colon cancer Mother   . Other Father     suicide  . Heart disease Father     ?  Marland Kitchen Hypertension Sister   . Stroke Neg Hx   . Diabetes Neg Hx   . Cancer Paternal Grandmother     lung ca    History   Social History  . Marital Status: Married    Spouse Name: N/A    Number of Children: N/A  . Years of Education: N/A   Occupational History  . Not on file.   Social History Main Topics  . Smoking status: Current Every Day Smoker -- 0.50 packs/day for 35 years    Types: Cigarettes  . Smokeless tobacco: Never Used     Comment: down to 7 a day 10-18-14  . Alcohol Use: 2.4 oz/week    4 Not specified per week     Comment: occasional  . Drug Use: Yes    Special: Marijuana     Comment: occasional use  . Sexual Activity: Yes   Other Topics Concern  . Not on file   Social History Narrative   Married, 2 children, unemployed currently, living in [place], but coming here for care, exercise calisthenics, walks in warm weather    No Known Allergies   Constitutional: Denies headache, fatigue, fever or abrupt weight changes.  HEENT:  Positive ear pain, sore throat and nasal congestion. Denies eye redness, eye pain, pressure behind the eyes, facial pain, ringing in the ears, wax buildup, runny nose or bloody nose. Respiratory: Denies difficulty breathing or shortness of  breath.  Cardiovascular: Denies chest pain, chest tightness, palpitations or swelling in the hands or feet.   No other specific complaints in a complete review of systems (except as listed in HPI above).  Objective:   BP 116/68 mmHg  Pulse 73  Temp(Src) 97.9 F (36.6 C) (Oral)  Wt 183 lb (83.008 kg)  SpO2 98% Wt Readings from Last 3 Encounters:  10/23/14 183 lb (83.008 kg)  10/18/14 184 lb 3.2 oz (83.553 kg)  10/17/14 185 lb (83.915 kg)     General: Appears her stated age, well developed, well nourished in NAD. HEENT: Head: normal shape and size, no sinus tenderness noted; Eyes: sclera white, no icterus, conjunctiva pink; Ears: fluid behind left ear, TMs gray and intact, normal light reflex; Nose: erythematous mucosa and moist, septum midline; Throat/Mouth: + PND. Teeth present, mucosa erythematous and moist, no exudate noted, no lesions or ulcerations noted.  Neck: Mild cervical lymphadenopathy, left. Neck supple, trachea midline.   Cardiovascular: Normal rate and rhythm. S1,S2 noted.  No murmur, rubs or gallops noted.  Pulmonary/Chest: Normal effort and positive vesicular breath sounds. No respiratory distress. No wheezes, rales  or ronchi noted.  Neurological: Alert and oriented.      Assessment & Plan:   ETD, left ear and post nasal drip:  OTC Zyrtec or Allegra daily x 1-2 weeks  Flonase 1 spray BID x 3 days then daily thereafter  RTC as needed or if symptoms persist.

## 2014-10-23 NOTE — Patient Instructions (Signed)
Barotitis Media Barotitis media is inflammation of your middle ear. This occurs when the auditory tube (eustachian tube) leading from the back of your nose (nasopharynx) to your eardrum is blocked. This blockage may result from a cold, environmental allergies, or an upper respiratory infection. Unresolved barotitis media may lead to damage or hearing loss (barotrauma), which may become permanent. HOME CARE INSTRUCTIONS   Use medicines as recommended by your health care provider. Over-the-counter medicines will help unblock the canal and can help during times of air travel.  Do not put anything into your ears to clean or unplug them. Eardrops will not be helpful.  Do not swim, dive, or fly until your health care provider says it is all right to do so. If these activities are necessary, chewing gum with frequent, forceful swallowing may help. It is also helpful to hold your nose and gently blow to pop your ears for equalizing pressure changes. This forces air into the eustachian tube.  Only take over-the-counter or prescription medicines for pain, discomfort, or fever as directed by your health care provider.  A decongestant may be helpful in decongesting the middle ear and make pressure equalization easier. SEEK MEDICAL CARE IF:  You experience a serious form of dizziness in which you feel as if the room is spinning and you feel nauseated (vertigo).  Your symptoms only involve one ear. SEEK IMMEDIATE MEDICAL CARE IF:   You develop a severe headache, dizziness, or severe ear pain.  You have bloody or pus-like drainage from your ears.  You develop a fever.  Your problems do not improve or become worse. MAKE SURE YOU:   Understand these instructions.  Will watch your condition.  Will get help right away if you are not doing well or get worse. Document Released: 08/28/2000 Document Revised: 06/21/2013 Document Reviewed: 03/28/2013 ExitCare Patient Information 2015 [employer]. This  information is not intended to replace advice given to you by your health care provider. Make sure you discuss any questions you have with your health care provider.  

## 2014-10-29 ENCOUNTER — Ambulatory Visit (AMBULATORY_SURGERY_CENTER): Payer: PRIVATE HEALTH INSURANCE | Admitting: Gastroenterology

## 2014-10-29 ENCOUNTER — Encounter: Payer: Self-pay | Admitting: Gastroenterology

## 2014-10-29 VITALS — BP 117/69 | HR 65 | Temp 97.7°F | Resp 26 | Ht 65.0 in | Wt 183.0 lb

## 2014-10-29 DIAGNOSIS — Z800 Family history of malignant neoplasm of digestive organs: Secondary | ICD-10-CM

## 2014-10-29 DIAGNOSIS — Z1211 Encounter for screening for malignant neoplasm of colon: Secondary | ICD-10-CM

## 2014-10-29 MED ORDER — SODIUM CHLORIDE 0.9 % IV SOLN: 500.0000 mL | INTRAVENOUS | Status: DC

## 2014-10-29 NOTE — Op Note (Signed)
[place]  [address]. [place], [address]   COLONOSCOPY PROCEDURE REPORT  PATIENT: Tamara Espinoza, Tamara Espinoza  MR#: [id number] BIRTHDATE: 1960-09-29 , 56  yrs. old GENDER: female ENDOSCOPIST: Inda Castle, MD REFERRED BY: PROCEDURE DATE:  10/29/2014 PROCEDURE:   Colonoscopy, screening First Screening Colonoscopy - Avg.  risk and is 50 yrs.  old or older Yes.  Prior Negative Screening - Now for repeat screening. 10 or more years since last screening  History of Adenoma - Now for follow-up colonoscopy & has been > or = to 3 yrs.  N/A  Polyps Removed Today? No.  Recommend repeat exam, <10 yrs? Polyps Removed Today? No.  Recommend repeat exam, <10 yrs? No. ASA CLASS:   Class II INDICATIONS:average risk patient for colon cancer. MEDICATIONS: Monitored anesthesia care and Propofol 400 mg IV  DESCRIPTION OF PROCEDURE:   After the risks benefits and alternatives of the procedure were thoroughly explained, informed consent was obtained.  The digital rectal exam revealed no abnormalities of the rectum.   The LB [id number]  endoscope was introduced through the anus and advanced to the cecum, which was identified by both the appendix and ileocecal valve. No adverse events experienced.   Limited by poor preparation.   The quality of the prep was fair using Suprep.  There was a moderate amount of retained solid stool in the right colon and large amounts of thick liquid stool in the left colonThe instrument was then slowly withdrawn as the colon was fully examined.      COLON FINDINGS: A normal appearing cecum, ileocecal valve, and appendiceal orifice were identified.  The ascending, transverse, descending, sigmoid colon, and rectum appeared unremarkable. Retroflexed views revealed no abnormalities. The time to cecum=8 minutes 33 seconds.  Withdrawal time=11 minutes 56 seconds.  The scope was withdrawn and the procedure completed. COMPLICATIONS: There were no  complications.  ENDOSCOPIC IMPRESSION: Normal colonoscopy (limited exam)  RECOMMENDATIONS: 1.  Given your significant family history of colon cancer, you should have a repeat colonoscopy in 5 years  eSigned:  Inda Castle, MD 10/29/2014 11:41 AM   cc:  Webb Silversmith, MD

## 2014-10-29 NOTE — Patient Instructions (Addendum)

## 2014-10-29 NOTE — Progress Notes (Signed)
A/ox3, pleased with MAC, report to RN 

## 2014-10-30 ENCOUNTER — Telehealth: Payer: Self-pay

## 2014-10-30 NOTE — Telephone Encounter (Signed)
Attempt follow up call.

## 2014-11-06 ENCOUNTER — Other Ambulatory Visit: Payer: Self-pay | Admitting: Internal Medicine

## 2014-11-28 ENCOUNTER — Other Ambulatory Visit: Payer: Federal, State, Local not specified - PPO

## 2015-03-07 ENCOUNTER — Other Ambulatory Visit: Payer: Self-pay | Admitting: Medical

## 2015-03-07 NOTE — Telephone Encounter (Signed)
LM on VM needs appointment

## 2015-03-07 NOTE — Telephone Encounter (Signed)
Ok to RF? Last seen 03/20/14 by you.

## 2015-06-12 ENCOUNTER — Telehealth: Payer: Self-pay | Admitting: Hematology and Oncology

## 2015-06-12 NOTE — Telephone Encounter (Signed)
s.w. pt dtr and r/s appt....pt ok and aware

## 2015-06-17 ENCOUNTER — Ambulatory Visit (HOSPITAL_BASED_OUTPATIENT_CLINIC_OR_DEPARTMENT_OTHER): Payer: PRIVATE HEALTH INSURANCE | Admitting: Hematology and Oncology

## 2015-06-17 ENCOUNTER — Encounter: Payer: Self-pay | Admitting: Hematology and Oncology

## 2015-06-17 ENCOUNTER — Telehealth: Payer: Self-pay | Admitting: Hematology and Oncology

## 2015-06-17 ENCOUNTER — Ambulatory Visit: Payer: PRIVATE HEALTH INSURANCE | Admitting: Hematology and Oncology

## 2015-06-17 ENCOUNTER — Other Ambulatory Visit (HOSPITAL_BASED_OUTPATIENT_CLINIC_OR_DEPARTMENT_OTHER): Payer: PRIVATE HEALTH INSURANCE

## 2015-06-17 ENCOUNTER — Other Ambulatory Visit: Payer: PRIVATE HEALTH INSURANCE

## 2015-06-17 DIAGNOSIS — L309 Dermatitis, unspecified: Secondary | ICD-10-CM

## 2015-06-17 DIAGNOSIS — R05 Cough: Secondary | ICD-10-CM | POA: Diagnosis not present

## 2015-06-17 DIAGNOSIS — Z720 Tobacco use: Secondary | ICD-10-CM | POA: Diagnosis not present

## 2015-06-17 DIAGNOSIS — F17200 Nicotine dependence, unspecified, uncomplicated: Secondary | ICD-10-CM

## 2015-06-17 DIAGNOSIS — D473 Essential (hemorrhagic) thrombocythemia: Secondary | ICD-10-CM | POA: Diagnosis not present

## 2015-06-17 DIAGNOSIS — R053 Chronic cough: Secondary | ICD-10-CM

## 2015-06-17 DIAGNOSIS — D75839 Thrombocytosis, unspecified: Secondary | ICD-10-CM

## 2015-06-17 HISTORY — DX: Chronic cough: R05.3

## 2015-06-17 LAB — CBC WITH DIFFERENTIAL/PLATELET
BASO%: 0.8 % (ref 0.0–2.0)
BASOS ABS: 0.1 10*3/uL (ref 0.0–0.1)
EOS%: 1.5 % (ref 0.0–7.0)
Eosinophils Absolute: 0.2 10*3/uL (ref 0.0–0.5)
HEMATOCRIT: 39.5 % (ref 34.8–46.6)
HEMOGLOBIN: 13 g/dL (ref 11.6–15.9)
LYMPH#: 3.1 10*3/uL (ref 0.9–3.3)
LYMPH%: 28.1 % (ref 14.0–49.7)
MCH: 28.3 pg (ref 25.1–34.0)
MCHC: 32.9 g/dL (ref 31.5–36.0)
MCV: 86 fL (ref 79.5–101.0)
MONO#: 0.4 10*3/uL (ref 0.1–0.9)
MONO%: 4 % (ref 0.0–14.0)
NEUT%: 65.6 % (ref 38.4–76.8)
NEUTROS ABS: 7.3 10*3/uL — AB (ref 1.5–6.5)
Platelets: 613 10*3/uL — ABNORMAL HIGH (ref 145–400)
RBC: 4.59 10*6/uL (ref 3.70–5.45)
RDW: 13.7 % (ref 11.2–14.5)
WBC: 11.2 10*3/uL — AB (ref 3.9–10.3)

## 2015-06-17 NOTE — Assessment & Plan Note (Signed)
There were no signs of infection at the site of eczema.

## 2015-06-17 NOTE — Telephone Encounter (Signed)
lvm for pt regarding to JUNE 2017 appt.Marland KitchenMarland KitchenMarland Kitchen

## 2015-06-17 NOTE — Progress Notes (Signed)
[place] OFFICE PROGRESS NOTE  Patient Care Team: Jearld Fenton, NP as PCP - General (Internal Medicine) Heath Lark, MD as Consulting Physician (Hematology and Oncology)  SUMMARY OF ONCOLOGIC HISTORY:  She was found to have abnormal CBC from blood draw from her primary care physician office that was ordered due to her symptoms. Her platelet count ranges from 600-900,000. She complained of altered perception/numbness of her hands and feet, worse in her hands. She say that she felt some cramps in the muscles. She was started on Neurontin for his. She denies skin itching. Denies any discoloration of her hands or feet. She have chronic sweating but she attributed to menopausal symptoms since the complete hysterectomy recently. In July 2015, peripheral blood for JAK2 mutation, BCR/ABL and MPL mutation were all negative. She is placed on aspirin therapy and observation  INTERVAL HISTORY: Please see below for problem oriented charting. She presents for further follow-up. Denies recent blood clot. She have chronic eczema in her legs. Denies recent infection. She continues to smoke and unable to quit. She had chronic cough.  REVIEW OF SYSTEMS:   Constitutional: Denies fevers, chills or abnormal weight loss Eyes: Denies blurriness of vision Ears, nose, mouth, throat, and face: Denies mucositis or sore throat Cardiovascular: Denies palpitation, chest discomfort or lower extremity swelling Gastrointestinal:  Denies nausea, heartburn or change in bowel habits Lymphatics: Denies new lymphadenopathy or easy bruising Neurological:Denies numbness, tingling or new weaknesses Behavioral/Psych: Mood is stable, no new changes  All other systems were reviewed with the patient and are negative.  I have reviewed the past medical history, past surgical history, social history and family history with the patient and they are unchanged from previous note.  ALLERGIES:  has No Known  Allergies.  MEDICATIONS:  Current Outpatient Prescriptions  Medication Sig Dispense Refill  . aspirin 81 MG tablet Take 81 mg by mouth daily.    Marland Kitchen atorvastatin (LIPITOR) 20 MG tablet TAKE 1 TABLET BY MOUTH EVERY DAY 30 tablet 11  . Multiple Vitamins-Minerals (MULTIVITAMIN WITH MINERALS) tablet Take 1 tablet by mouth daily.    Marland Kitchen triamcinolone cream (KENALOG) 0.1 % Apply topically 2 (two) times daily. 30 g 2  . venlafaxine (EFFEXOR) 37.5 MG tablet Take 1 tablet (37.5 mg total) by mouth 2 (two) times daily with a meal. 60 tablet 11   No current facility-administered medications for this visit.    PHYSICAL EXAMINATION: ECOG PERFORMANCE STATUS: 0 - Asymptomatic GENERAL:alert, no distress and comfortable SKIN: Noted eczema skin rash on the right anterior part of her tibia EYES: normal, Conjunctiva are pink and non-injected, sclera clear OROPHARYNX:no exudate, no erythema and lips, buccal mucosa, and tongue normal  NECK: supple, thyroid normal size, non-tender, without nodularity LYMPH:  no palpable lymphadenopathy in the cervical, axillary or inguinal LUNGS: clear to auscultation and percussion with normal breathing effort HEART: regular rate & rhythm and no murmurs and no lower extremity edema ABDOMEN:abdomen soft, non-tender and normal bowel sounds Musculoskeletal:no cyanosis of digits and no clubbing  NEURO: alert & oriented x 3 with fluent speech, no focal motor/sensory deficits  LABORATORY DATA:  I have reviewed the data as listed    Component Value Date/Time   NA 139 10/23/2014 1039   NA 138 02/08/2014 2210   K 4.0 10/23/2014 1039   K 3.5 02/08/2014 2210   CL 107 10/23/2014 1039   CL 107 02/08/2014 2210   CO2 28 10/23/2014 1039   CO2 25 02/08/2014 2210   GLUCOSE 69* 10/23/2014  1039   GLUCOSE 110* 02/08/2014 2210   BUN 12 10/23/2014 1039   BUN 16 02/08/2014 2210   CREATININE 0.57 10/23/2014 1039   CREATININE 0.68 02/08/2014 2210   CREATININE 0.50 09/27/2012 1057    CALCIUM 9.9 10/23/2014 1039   CALCIUM 9.5 02/08/2014 2210   PROT 7.5 10/23/2014 1039   ALBUMIN 4.1 10/23/2014 1039   AST 23 10/23/2014 1039   ALT 21 10/23/2014 1039   ALKPHOS 126* 10/23/2014 1039   BILITOT 0.4 10/23/2014 1039   GFRNONAA >60 02/08/2014 2210   GFRAA >60 02/08/2014 2210    No results found for: SPEP, UPEP  Lab Results  Component Value Date   WBC 11.2* 06/17/2015   NEUTROABS 7.3* 06/17/2015   HGB 13.0 06/17/2015   HCT 39.5 06/17/2015   MCV 86.0 06/17/2015   PLT 613* 06/17/2015      Chemistry      Component Value Date/Time   NA 139 10/23/2014 1039   NA 138 02/08/2014 2210   K 4.0 10/23/2014 1039   K 3.5 02/08/2014 2210   CL 107 10/23/2014 1039   CL 107 02/08/2014 2210   CO2 28 10/23/2014 1039   CO2 25 02/08/2014 2210   BUN 12 10/23/2014 1039   BUN 16 02/08/2014 2210   CREATININE 0.57 10/23/2014 1039   CREATININE 0.68 02/08/2014 2210   CREATININE 0.50 09/27/2012 1057      Component Value Date/Time   CALCIUM 9.9 10/23/2014 1039   CALCIUM 9.5 02/08/2014 2210   ALKPHOS 126* 10/23/2014 1039   AST 23 10/23/2014 1039   ALT 21 10/23/2014 1039   BILITOT 0.4 10/23/2014 1039      ASSESSMENT & PLAN:  Thrombocytosis All the workup for myeloproliferative disorder came back negative. This would still not exclude possible diagnosis of essential thrombocytosis. I discussed with the patient and her husband I am not keen to perform bone marrow aspirate and biopsy right now. In the absence of history of thrombosis, she would not need to be started on chemotherapy. I recommend she continue on aspirin 81 mg daily.    Current smoker I spent some time counseling the patient the importance of tobacco cessation. she is currently attempting to quit on her own  Chronic cough She have history of chronic cough. She has smoked for over 34 years, over the age of 83 and at some point in time has smoked almost a pack of cigarettes a day. I recommend low-dose CT scan of  the chest for screening for lung cancer.  Eczema There were no signs of infection at the site of eczema.   Orders Placed This Encounter  Procedures  . CT CHEST LUNG CA SCREEN LOW DOSE W/O CM    Standing Status: Future     Number of Occurrences:      Standing Expiration Date: 08/16/2016    Order Specific Question:  Reason for Exam (SYMPTOM  OR DIAGNOSIS REQUIRED)    Answer:  smoker, chronic cough, exclude lung ca    Order Specific Question:  Is the patient pregnant?    Answer:  No    Order Specific Question:  Preferred Imaging Location?    Answer:  [facility]  . CBC with Differential/Platelet    Standing Status: Future     Number of Occurrences:      Standing Expiration Date: 07/21/2016   All questions were answered. The patient knows to call the clinic with any problems, questions or concerns. No barriers to learning was detected. I spent  15 minutes counseling the patient face to face. The total time spent in the appointment was 20 minutes and more than 50% was on counseling and review of test results     [facility], [place], MD 06/17/2015 1:35 PM

## 2015-06-17 NOTE — Assessment & Plan Note (Signed)
All the workup for myeloproliferative disorder came back negative. This would still not exclude possible diagnosis of essential thrombocytosis. I discussed with the patient and her husband I am not keen to perform bone marrow aspirate and biopsy right now. In the absence of history of thrombosis, she would not need to be started on chemotherapy. I recommend she continue on aspirin 81 mg daily. 

## 2015-06-17 NOTE — Assessment & Plan Note (Addendum)
She have history of chronic cough. She has smoked for over 34 years, over the age of 71 and at some point in time has smoked almost a pack of cigarettes a day. I recommend low-dose CT scan of the chest for screening for lung cancer.

## 2015-06-17 NOTE — Assessment & Plan Note (Signed)
I spent some time counseling the patient the importance of tobacco cessation. she is currently attempting to quit on her own 

## 2015-06-26 ENCOUNTER — Other Ambulatory Visit: Payer: Self-pay | Admitting: Hematology and Oncology

## 2015-06-26 ENCOUNTER — Ambulatory Visit (HOSPITAL_COMMUNITY)
Admission: RE | Admit: 2015-06-26 | Discharge: 2015-06-26 | Disposition: A | Discharge status: Home / Self Care | Payer: No Typology Code available for payment source | Source: Ambulatory Visit | Attending: Hematology and Oncology | Admitting: Hematology and Oncology

## 2015-06-26 DIAGNOSIS — R05 Cough: Secondary | ICD-10-CM

## 2015-06-26 DIAGNOSIS — R053 Chronic cough: Secondary | ICD-10-CM

## 2015-06-26 DIAGNOSIS — R590 Localized enlarged lymph nodes: Secondary | ICD-10-CM | POA: Insufficient documentation

## 2015-06-26 DIAGNOSIS — Z720 Tobacco use: Secondary | ICD-10-CM | POA: Diagnosis present

## 2015-06-26 DIAGNOSIS — F17200 Nicotine dependence, unspecified, uncomplicated: Secondary | ICD-10-CM

## 2015-06-26 DIAGNOSIS — R911 Solitary pulmonary nodule: Secondary | ICD-10-CM | POA: Diagnosis not present

## 2015-06-27 ENCOUNTER — Telehealth: Payer: Self-pay | Admitting: Internal Medicine

## 2015-06-27 ENCOUNTER — Telehealth: Payer: Self-pay | Admitting: *Deleted

## 2015-06-27 ENCOUNTER — Other Ambulatory Visit: Payer: Self-pay | Admitting: Hematology and Oncology

## 2015-06-27 DIAGNOSIS — R911 Solitary pulmonary nodule: Secondary | ICD-10-CM

## 2015-06-27 NOTE — Telephone Encounter (Signed)
I reviewed the test result with the patient.  I recommend repeat CT scan in June. I recommend the patient to quit smoking

## 2015-06-27 NOTE — Telephone Encounter (Signed)
Pt asking for results of her CT scan yesterday.

## 2015-06-27 NOTE — Telephone Encounter (Signed)
Patient is ready to quit smoking.  She wants a prescription for Chantix.  She wants this called into the [facility] on [address]. She also wants a call back to let her know if we were able to call this in for her.

## 2015-06-28 ENCOUNTER — Other Ambulatory Visit: Payer: Self-pay | Admitting: Internal Medicine

## 2015-06-28 MED ORDER — VARENICLINE TARTRATE 0.5 MG X 11 & 1 MG X 42 PO MISC: ORAL | Status: DC

## 2015-06-28 NOTE — Telephone Encounter (Signed)
Rx faxed

## 2015-06-28 NOTE — Telephone Encounter (Signed)
Left detailed msg on VM per HIPAA  

## 2015-06-28 NOTE — Telephone Encounter (Signed)
RX printed and signed. Will need to be faxed.

## 2015-08-14 ENCOUNTER — Encounter: Payer: Federal, State, Local not specified - PPO | Admitting: Internal Medicine

## 2015-08-14 ENCOUNTER — Encounter: Payer: PRIVATE HEALTH INSURANCE | Admitting: Internal Medicine

## 2015-08-22 ENCOUNTER — Ambulatory Visit (INDEPENDENT_AMBULATORY_CARE_PROVIDER_SITE_OTHER): Payer: No Typology Code available for payment source | Admitting: Internal Medicine

## 2015-08-22 ENCOUNTER — Other Ambulatory Visit: Payer: Self-pay | Admitting: Internal Medicine

## 2015-08-22 ENCOUNTER — Encounter: Payer: Self-pay | Admitting: Internal Medicine

## 2015-08-22 VITALS — BP 124/82 | HR 65 | Temp 98.2°F | Ht 65.0 in | Wt 177.0 lb

## 2015-08-22 DIAGNOSIS — E785 Hyperlipidemia, unspecified: Secondary | ICD-10-CM

## 2015-08-22 DIAGNOSIS — D75839 Thrombocytosis, unspecified: Secondary | ICD-10-CM

## 2015-08-22 DIAGNOSIS — Z0000 Encounter for general adult medical examination without abnormal findings: Secondary | ICD-10-CM

## 2015-08-22 DIAGNOSIS — F32A Depression, unspecified: Secondary | ICD-10-CM

## 2015-08-22 DIAGNOSIS — L309 Dermatitis, unspecified: Secondary | ICD-10-CM | POA: Diagnosis not present

## 2015-08-22 DIAGNOSIS — F329 Major depressive disorder, single episode, unspecified: Secondary | ICD-10-CM

## 2015-08-22 DIAGNOSIS — Z720 Tobacco use: Secondary | ICD-10-CM | POA: Diagnosis not present

## 2015-08-22 DIAGNOSIS — R05 Cough: Secondary | ICD-10-CM

## 2015-08-22 DIAGNOSIS — D473 Essential (hemorrhagic) thrombocythemia: Secondary | ICD-10-CM | POA: Diagnosis not present

## 2015-08-22 DIAGNOSIS — R053 Chronic cough: Secondary | ICD-10-CM

## 2015-08-22 DIAGNOSIS — F17200 Nicotine dependence, unspecified, uncomplicated: Secondary | ICD-10-CM

## 2015-08-22 LAB — COMPREHENSIVE METABOLIC PANEL
ALBUMIN: 4.2 g/dL (ref 3.5–5.2)
ALK PHOS: 125 U/L — AB (ref 39–117)
ALT: 17 U/L (ref 0–35)
AST: 21 U/L (ref 0–37)
BUN: 10 mg/dL (ref 6–23)
CO2: 27 mEq/L (ref 19–32)
Calcium: 10 mg/dL (ref 8.4–10.5)
Chloride: 106 mEq/L (ref 96–112)
Creatinine, Ser: 0.56 mg/dL (ref 0.40–1.20)
GFR: 145.9 mL/min (ref >60.00)
GLUCOSE: 97 mg/dL (ref 70–99)
POTASSIUM: 3.7 meq/L (ref 3.5–5.1)
Sodium: 141 mEq/L (ref 135–145)
TOTAL PROTEIN: 7.5 g/dL (ref 6.0–8.3)
Total Bilirubin: 0.5 mg/dL (ref 0.2–1.2)

## 2015-08-22 LAB — CBC
HCT: 40.9 % (ref 36.0–46.0)
HEMOGLOBIN: 13.2 g/dL (ref 12.0–15.0)
MCHC: 32.3 g/dL (ref 30.0–36.0)
MCV: 87.4 fl (ref 78.0–100.0)
Platelets: 692 10*3/uL — ABNORMAL HIGH (ref 150.0–400.0)
RBC: 4.68 Mil/uL (ref 3.87–5.11)
RDW: 14.3 % (ref 11.5–15.5)
WBC: 11.1 10*3/uL — AB (ref 4.0–10.5)

## 2015-08-22 LAB — LIPID PANEL
CHOLESTEROL: 184 mg/dL (ref 0–200)
HDL: 41.5 mg/dL (ref >39.00)
LDL Cholesterol: 126 mg/dL — ABNORMAL HIGH (ref 0–99)
NonHDL: 142.59
Total CHOL/HDL Ratio: 4
Triglycerides: 81 mg/dL (ref 0.0–149.0)
VLDL: 16.2 mg/dL (ref 0.0–40.0)

## 2015-08-22 LAB — HEMOGLOBIN A1C: Hgb A1c MFr Bld: 6.3 % (ref 4.6–6.5)

## 2015-08-22 NOTE — Patient Instructions (Signed)
Health Maintenance, Female Adopting a healthy lifestyle and getting preventive care can go a long way to promote health and wellness. Talk with your health care provider about what schedule of regular examinations is right for you. This is a good chance for you to check in with your provider about disease prevention and staying healthy. In between checkups, there are plenty of things you can do on your own. Experts have done a lot of research about which lifestyle changes and preventive measures are most likely to keep you healthy. Ask your health care provider for more information. WEIGHT AND DIET  Eat a healthy diet  Be sure to include plenty of vegetables, fruits, low-fat dairy products, and lean protein.  Do not eat a lot of foods high in solid fats, added sugars, or salt.  Get regular exercise. This is one of the most important things you can do for your health.  Most adults should exercise for at least 150 minutes each week. The exercise should increase your heart rate and make you sweat (moderate-intensity exercise).  Most adults should also do strengthening exercises at least twice a week. This is in addition to the moderate-intensity exercise.  Maintain a healthy weight  Body mass index (BMI) is a measurement that can be used to identify possible weight problems. It estimates body fat based on height and weight. Your health care provider can help determine your BMI and help you achieve or maintain a healthy weight.  For females 29 years of age and older:   A BMI below 18.5 is considered underweight.  A BMI of 18.5 to 24.9 is normal.  A BMI of 25 to 29.9 is considered overweight.  A BMI of 30 and above is considered obese.  Watch levels of cholesterol and blood lipids  You should start having your blood tested for lipids and cholesterol at 52 years of age, then have this test every 5 years.  You may need to have your cholesterol levels checked more often if:  Your lipid  or cholesterol levels are high.  You are older than 52 years of age.  You are at high risk for heart disease.  CANCER SCREENING   Lung Cancer  Lung cancer screening is recommended for adults 81-56 years old who are at high risk for lung cancer because of a history of smoking.  A yearly low-dose CT scan of the lungs is recommended for people who:  Currently smoke.  Have quit within the past 15 years.  Have at least a 30-pack-year history of smoking. A pack year is smoking an average of one pack of cigarettes a day for 1 year.  Yearly screening should continue until it has been 15 years since you quit.  Yearly screening should stop if you develop a health problem that would prevent you from having lung cancer treatment.  Breast Cancer  Practice breast self-awareness. This means understanding how your breasts normally appear and feel.  It also means doing regular breast self-exams. Let your health care provider know about any changes, no matter how small.  If you are in your 20s or 30s, you should have a clinical breast exam (CBE) by a health care provider every 1-3 years as part of a regular health exam.  If you are 68 or older, have a CBE every year. Also consider having a breast X-ray (mammogram) every year.  If you have a family history of breast cancer, talk to your health care provider about genetic screening.  If you  are at high risk for breast cancer, talk to your health care provider about having an MRI and a mammogram every year.  Breast cancer gene (BRCA) assessment is recommended for women who have family members with BRCA-related cancers. BRCA-related cancers include:  Breast.  Ovarian.  Tubal.  Peritoneal cancers.  Results of the assessment will determine the need for genetic counseling and BRCA1 and BRCA2 testing. Cervical Cancer Your health care provider may recommend that you be screened regularly for cancer of the pelvic organs (ovaries, uterus, and  vagina). This screening involves a pelvic examination, including checking for microscopic changes to the surface of your cervix (Pap test). You may be encouraged to have this screening done every 3 years, beginning at age 21.  For women ages 30-65, health care providers may recommend pelvic exams and Pap testing every 3 years, or they may recommend the Pap and pelvic exam, combined with testing for human papilloma virus (HPV), every 5 years. Some types of HPV increase your risk of cervical cancer. Testing for HPV may also be done on women of any age with unclear Pap test results.  Other health care providers may not recommend any screening for nonpregnant women who are considered low risk for pelvic cancer and who do not have symptoms. Ask your health care provider if a screening pelvic exam is right for you.  If you have had past treatment for cervical cancer or a condition that could lead to cancer, you need Pap tests and screening for cancer for at least 20 years after your treatment. If Pap tests have been discontinued, your risk factors (such as having a new sexual partner) need to be reassessed to determine if screening should resume. Some women have medical problems that increase the chance of getting cervical cancer. In these cases, your health care provider may recommend more frequent screening and Pap tests. Colorectal Cancer  This type of cancer can be detected and often prevented.  Routine colorectal cancer screening usually begins at 52 years of age and continues through 52 years of age.  Your health care provider may recommend screening at an earlier age if you have risk factors for colon cancer.  Your health care provider may also recommend using home test kits to check for hidden blood in the stool.  A small camera at the end of a tube can be used to examine your colon directly (sigmoidoscopy or colonoscopy). This is done to check for the earliest forms of colorectal  cancer.  Routine screening usually begins at age 50.  Direct examination of the colon should be repeated every 5-10 years through 52 years of age. However, you may need to be screened more often if early forms of precancerous polyps or small growths are found. Skin Cancer  Check your skin from head to toe regularly.  Tell your health care provider about any new moles or changes in moles, especially if there is a change in a mole's shape or color.  Also tell your health care provider if you have a mole that is larger than the size of a pencil eraser.  Always use sunscreen. Apply sunscreen liberally and repeatedly throughout the day.  Protect yourself by wearing long sleeves, pants, a wide-brimmed hat, and sunglasses whenever you are outside. HEART DISEASE, DIABETES, AND HIGH BLOOD PRESSURE   High blood pressure causes heart disease and increases the risk of stroke. High blood pressure is more likely to develop in:  People who have blood pressure in the high end   of the normal range (130-139/85-89 mm Hg).  People who are overweight or obese.  People who are African American.  If you are 38-23 years of age, have your blood pressure checked every 3-5 years. If you are 61 years of age or older, have your blood pressure checked every year. You should have your blood pressure measured twice--once when you are at a hospital or clinic, and once when you are not at a hospital or clinic. Record the average of the two measurements. To check your blood pressure when you are not at a hospital or clinic, you can use:  An automated blood pressure machine at a pharmacy.  A home blood pressure monitor.  If you are between 45 years and 39 years old, ask your health care provider if you should take aspirin to prevent strokes.  Have regular diabetes screenings. This involves taking a blood sample to check your fasting blood sugar level.  If you are at a normal weight and have a low risk for diabetes,  have this test once every three years after 52 years of age.  If you are overweight and have a high risk for diabetes, consider being tested at a younger age or more often. PREVENTING INFECTION  Hepatitis B  If you have a higher risk for hepatitis B, you should be screened for this virus. You are considered at high risk for hepatitis B if:  You were born in a country where hepatitis B is common. Ask your health care provider which countries are considered high risk.  Your parents were born in a high-risk country, and you have not been immunized against hepatitis B (hepatitis B vaccine).  You have HIV or AIDS.  You use needles to inject street drugs.  You live with someone who has hepatitis B.  You have had sex with someone who has hepatitis B.  You get hemodialysis treatment.  You take certain medicines for conditions, including cancer, organ transplantation, and autoimmune conditions. Hepatitis C  Blood testing is recommended for:  Everyone born from [birth year] through 1963.  Anyone with known risk factors for hepatitis C. Sexually transmitted infections (STIs)  You should be screened for sexually transmitted infections (STIs) including gonorrhea and chlamydia if:  You are sexually active and are younger than 52 years of age.  You are older than 53 years of age and your health care provider tells you that you are at risk for this type of infection.  Your sexual activity has changed since you were last screened and you are at an increased risk for chlamydia or gonorrhea. Ask your health care provider if you are at risk.  If you do not have HIV, but are at risk, it may be recommended that you take a prescription medicine daily to prevent HIV infection. This is called pre-exposure prophylaxis (PrEP). You are considered at risk if:  You are sexually active and do not regularly use condoms or know the HIV status of your partner(s).  You take drugs by injection.  You are sexually  active with a partner who has HIV. Talk with your health care provider about whether you are at high risk of being infected with HIV. If you choose to begin PrEP, you should first be tested for HIV. You should then be tested every 3 months for as long as you are taking PrEP.  PREGNANCY   If you are premenopausal and you may become pregnant, ask your health care provider about preconception counseling.  If you may  become pregnant, take 400 to 800 micrograms (mcg) of folic acid every day.  If you want to prevent pregnancy, talk to your health care provider about birth control (contraception). OSTEOPOROSIS AND MENOPAUSE   Osteoporosis is a disease in which the bones lose minerals and strength with aging. This can result in serious bone fractures. Your risk for osteoporosis can be identified using a bone density scan.  If you are 61 years of age or older, or if you are at risk for osteoporosis and fractures, ask your health care provider if you should be screened.  Ask your health care provider whether you should take a calcium or vitamin D supplement to lower your risk for osteoporosis.  Menopause may have certain physical symptoms and risks.  Hormone replacement therapy may reduce some of these symptoms and risks. Talk to your health care provider about whether hormone replacement therapy is right for you.  HOME CARE INSTRUCTIONS   Schedule regular health, dental, and eye exams.  Stay current with your immunizations.   Do not use any tobacco products including cigarettes, chewing tobacco, or electronic cigarettes.  If you are pregnant, do not drink alcohol.  If you are breastfeeding, limit how much and how often you drink alcohol.  Limit alcohol intake to no more than 1 drink per day for nonpregnant women. One drink equals 12 ounces of beer, 5 ounces of wine, or 1 ounces of hard liquor.  Do not use street drugs.  Do not share needles.  Ask your health care provider for help if  you need support or information about quitting drugs.  Tell your health care provider if you often feel depressed.  Tell your health care provider if you have ever been abused or do not feel safe at home.   This information is not intended to replace advice given to you by your health care provider. Make sure you discuss any questions you have with your health care provider.   Document Released: 03/16/2011 Document Revised: 09/21/2014 Document Reviewed: 08/02/2013 Elsevier Interactive Patient Education [employer].

## 2015-08-22 NOTE — Assessment & Plan Note (Signed)
Stable on Effexor, will continue CMET today

## 2015-08-22 NOTE — Progress Notes (Signed)
Subjective:    Patient ID: Tamara Espinoza, female    DOB: 07/22/1961, 54 y.o.   MRN: [id number]  HPI  Pt presents to the clinic today for her annual exam. She is also due for follow up of chronic conditions, see separate note.  Flu: 05/2015 Tetanus: 2014 Pneumovax: 2014 Pap Smear: 09/2012 Mammogram: 10/2014 Colon Screening: 10/2014 Vision Screening: yearly Dentist: as needed  Diet: She consumes meat. She eats veggies daily. She does not consume fruit but a few times per week. She drinks mostly water. Exercise: She does the elliptical for 20 minutes, and treadmill for 30 minutes and bike for 30 minutes, at least 3 days per week.  Review of Systems      Past Medical History  Diagnosis Date  . Eczema   . Anemia   . Thrombocytosis ([place]) 04/05/2014  . Chicken pox   . Mood changes ([place])   . Chronic cough 06/17/2015    Current Outpatient Prescriptions  Medication Sig Dispense Refill  . aspirin 81 MG tablet Take 81 mg by mouth daily.    Marland Kitchen atorvastatin (LIPITOR) 20 MG tablet TAKE 1 TABLET BY MOUTH EVERY DAY 30 tablet 11  . Multiple Vitamins-Minerals (MULTIVITAMIN WITH MINERALS) tablet Take 1 tablet by mouth daily.    Marland Kitchen triamcinolone cream (KENALOG) 0.1 % Apply topically 2 (two) times daily. 30 g 2  . varenicline (CHANTIX STARTING MONTH PAK) 0.5 MG X 11 & 1 MG X 42 tablet Take one 0.5 mg tablet by mouth once daily for 3 days, then increase to one 0.5 mg tablet twice daily for 4 days, then increase to one 1 mg tablet twice daily. 53 tablet 0  . venlafaxine (EFFEXOR) 37.5 MG tablet Take 1 tablet (37.5 mg total) by mouth 2 (two) times daily with a meal. 60 tablet 11   No current facility-administered medications for this visit.    No Known Allergies  Family History  Problem Relation Age of Onset  . Cancer Mother 79    colon  . Hypertension Mother   . Colon cancer Mother   . Other Father     suicide  . Heart disease Father     ?  Marland Kitchen Hypertension Sister   . Stroke Neg Hx   .  Diabetes Neg Hx   . Stomach cancer Neg Hx   . Cancer Paternal Grandmother     lung ca    Social History   Social History  . Marital Status: Married    Spouse Name: N/A  . Number of Children: N/A  . Years of Education: N/A   Occupational History  . Not on file.   Social History Main Topics  . Smoking status: Current Every Day Smoker -- 0.50 packs/day for 35 years    Types: Cigarettes  . Smokeless tobacco: Never Used  . Alcohol Use: 2.4 oz/week    4 Standard drinks or equivalent per week     Comment: occasional  . Drug Use: Yes    Special: Marijuana     Comment: 2 puffs per day  . Sexual Activity: Yes   Other Topics Concern  . Not on file   Social History Narrative   Married, 2 children, unemployed currently, living in [place], but coming here for care, exercise calisthenics, walks in warm weather     Constitutional: Denies fever, malaise, fatigue, headache or abrupt weight changes.  HEENT: Denies eye pain, eye redness, ear pain, ringing in the ears, wax buildup, runny nose, nasal congestion, bloody  nose, or sore throat. Respiratory: Denies difficulty breathing, shortness of breath, cough or sputum production.   Cardiovascular: Denies chest pain, chest tightness, palpitations or swelling in the hands or feet.  Gastrointestinal: Denies abdominal pain, bloating, constipation, diarrhea or blood in the stool.  GU: Denies urgency, frequency, pain with urination, burning sensation, blood in urine, odor or discharge. Musculoskeletal: Denies decrease in range of motion, difficulty with gait, muscle pain or joint pain and swelling.  Skin: Denies redness, rashes, lesions or ulcercations.  Neurological: Denies dizziness, difficulty with memory, difficulty with speech or problems with balance and coordination.  Psych: Pt reports chronic but controlled depression. Denies anxiety,SI/HI.  No other specific complaints in a complete review of systems (except as listed in HPI  above).  Objective:   Physical Exam  BP 124/82 mmHg  Pulse 65  Temp(Src) 98.2 F (36.8 C) (Oral)  Ht 5\' 5"  (1.651 m)  Wt 177 lb (80.287 kg)  BMI 29.45 kg/m2  SpO2 99% Wt Readings from Last 3 Encounters:  08/22/15 177 lb (80.287 kg)  10/29/14 183 lb (83.008 kg)  10/23/14 183 lb (83.008 kg)    General: Appears her stated age, well developed, well nourished in NAD. Skin: Warm, dry and intact. No rashes, lesions or ulcerations noted. HEENT: Head: normal shape and size; Eyes: sclera white, no icterus, conjunctiva pink, PERRLA and EOMs intact; Ears: Tm's gray and intact, normal light reflex; Throat/Mouth: Teeth present, mucosa pink and moist, no exudate, lesions or ulcerations noted.  Neck:  Neck supple, trachea midline. No masses, lumps or thyromegaly present.  Cardiovascular: Normal rate and rhythm. S1,S2 noted.  No murmur, rubs or gallops noted. No BLE edema. No carotid bruits noted. Pulmonary/Chest: Normal effort and positive vesicular breath sounds. No respiratory distress. No wheezes, rales or ronchi noted.  Abdomen: Soft and nontender. Normal bowel sounds No distention or masses noted. Liver, spleen and kidneys non palpable. Musculoskeletal: Strength 5/45 BUE/BLE. No signs of joint swelling. No difficulty with gait.  Neurological: Alert and oriented. Cranial nerves II-XII grossly intact. Coordination normal.  Psychiatric: Mood and affect normal. Behavior is normal. Judgment and thought content normal.     BMET    Component Value Date/Time   NA 139 10/23/2014 1039   NA 138 02/08/2014 2210   K 4.0 10/23/2014 1039   K 3.5 02/08/2014 2210   CL 107 10/23/2014 1039   CL 107 02/08/2014 2210   CO2 28 10/23/2014 1039   CO2 25 02/08/2014 2210   GLUCOSE 69* 10/23/2014 1039   GLUCOSE 110* 02/08/2014 2210   BUN 12 10/23/2014 1039   BUN 16 02/08/2014 2210   CREATININE 0.57 10/23/2014 1039   CREATININE 0.68 02/08/2014 2210   CREATININE 0.50 09/27/2012 1057   CALCIUM 9.9 10/23/2014  1039   CALCIUM 9.5 02/08/2014 2210   GFRNONAA >60 02/08/2014 2210   GFRAA >60 02/08/2014 2210    Lipid Panel     Component Value Date/Time   CHOL 184 10/23/2014 1039   TRIG 109.0 10/23/2014 1039   HDL 44.60 10/23/2014 1039   CHOLHDL 4 10/23/2014 1039   VLDL 21.8 10/23/2014 1039   LDLCALC 118* 10/23/2014 1039    CBC    Component Value Date/Time   WBC 11.2* 06/17/2015 1247   WBC 11.1* 08/13/2014 1011   WBC 10.7 02/08/2014 2210   RBC 4.59 06/17/2015 1247   RBC 4.57 08/13/2014 1011   RBC 4.84 02/08/2014 2210   HGB 13.0 06/17/2015 1247   HGB 13.0 08/13/2014 1011   HGB  14.4 02/08/2014 2210   HCT 39.5 06/17/2015 1247   HCT 40.7 08/13/2014 1011   HCT 43.1 02/08/2014 2210   PLT 613* 06/17/2015 1247   PLT 655.0* 08/13/2014 1011   PLT 570* 02/08/2014 2210   MCV 86.0 06/17/2015 1247   MCV 89.0 08/13/2014 1011   MCV 89 02/08/2014 2210   MCH 28.3 06/17/2015 1247   MCH 29.0 03/20/2014 1219   MCH 29.7 02/08/2014 2210   MCHC 32.9 06/17/2015 1247   MCHC 32.0 08/13/2014 1011   MCHC 33.4 02/08/2014 2210   RDW 13.7 06/17/2015 1247   RDW 13.7 08/13/2014 1011   RDW 13.7 02/08/2014 2210   LYMPHSABS 3.1 06/17/2015 1247   LYMPHSABS 2.6 08/13/2014 1011   MONOABS 0.4 06/17/2015 1247   MONOABS 0.3 08/13/2014 1011   EOSABS 0.2 06/17/2015 1247   EOSABS 0.2 08/13/2014 1011   BASOSABS 0.1 06/17/2015 1247   BASOSABS 0.1 08/13/2014 1011    Hgb A1C No results found for: HGBA1C       Assessment & Plan:   Preventative Health Maintenance  All HM UTD Encouraged her to continue to work on diet and exercise Advised her to see an eye doctor at least annually Will refer her for lung cancer screeing- someone will call you with an appt Will check CBC, CMET, Lipid, A1C, HIV and Hep C today  RTC in 1 year for annual exam/followup  HPI:  Pt presents to the clinic today for follow up of chronic conditions:  Chronic cough: Resolved by using Flonase as needed.  Depression: Her symptoms  are stable on Effexor. She denies SI/HI.  Eczema: She uses Kenalog cream prn.  HLD: She denies myalgias on Lipitor. Her last LDL was 118. She does try to consume a low fat diet.  Thrombocytosis: No issues.  Smoker: She is motivated to quit. Chantix did not work. She is using Nicoderm patches. They are causing some skin irritation but she can deal with that.  Review of Systems:   Past Medical History  Diagnosis Date  . Eczema   . Anemia   . Thrombocytosis ([place]) 04/05/2014  . Chicken pox   . Mood changes ([place])   . Chronic cough 06/17/2015    Current Outpatient Prescriptions  Medication Sig Dispense Refill  . aspirin 81 MG tablet Take 81 mg by mouth daily.    Marland Kitchen atorvastatin (LIPITOR) 20 MG tablet TAKE 1 TABLET BY MOUTH EVERY DAY 30 tablet 11  . Multiple Vitamins-Minerals (MULTIVITAMIN WITH MINERALS) tablet Take 1 tablet by mouth daily.    Marland Kitchen triamcinolone cream (KENALOG) 0.1 % Apply topically 2 (two) times daily. 30 g 2  . varenicline (CHANTIX STARTING MONTH PAK) 0.5 MG X 11 & 1 MG X 42 tablet Take one 0.5 mg tablet by mouth once daily for 3 days, then increase to one 0.5 mg tablet twice daily for 4 days, then increase to one 1 mg tablet twice daily. 53 tablet 0  . venlafaxine (EFFEXOR) 37.5 MG tablet Take 1 tablet (37.5 mg total) by mouth 2 (two) times daily with a meal. 60 tablet 11   No current facility-administered medications for this visit.    No Known Allergies  Family History  Problem Relation Age of Onset  . Cancer Mother 73    colon  . Hypertension Mother   . Colon cancer Mother   . Other Father     suicide  . Heart disease Father     ?  Marland Kitchen Hypertension Sister   . Stroke  Neg Hx   . Diabetes Neg Hx   . Stomach cancer Neg Hx   . Cancer Paternal Grandmother     lung ca    Social History   Social History  . Marital Status: Married    Spouse Name: N/A  . Number of Children: N/A  . Years of Education: N/A   Occupational History  . Not on file.   Social  History Main Topics  . Smoking status: Current Every Day Smoker -- 0.50 packs/day for 35 years    Types: Cigarettes  . Smokeless tobacco: Never Used  . Alcohol Use: 2.4 oz/week    4 Standard drinks or equivalent per week     Comment: occasional  . Drug Use: Yes    Special: Marijuana     Comment: 2 puffs per day  . Sexual Activity: Yes   Other Topics Concern  . Not on file   Social History Narrative   Married, 2 children, unemployed currently, living in [place], but coming here for care, exercise calisthenics, walks in warm weather     Constitutional: Denies fever, malaise, fatigue, headache or abrupt weight changes.  HEENT: Denies eye pain, eye redness, ear pain, ringing in the ears, wax buildup, runny nose, nasal congestion, bloody nose, or sore throat. Respiratory: Denies difficulty breathing, shortness of breath, cough or sputum production.   Cardiovascular: Denies chest pain, chest tightness, palpitations or swelling in the hands or feet.  Gastrointestinal: Denies abdominal pain, bloating, constipation, diarrhea or blood in the stool.  GU: Denies urgency, frequency, pain with urination, burning sensation, blood in urine, odor or discharge. Musculoskeletal: Denies decrease in range of motion, difficulty with gait, muscle pain or joint pain and swelling.  Skin: Denies redness, rashes, lesions or ulcercations.  Neurological: Denies dizziness, difficulty with memory, difficulty with speech or problems with balance and coordination.  Psych: Pt reports chronic but controlled depression. Denies anxiety, SI/HI.  No other specific complaints in a complete review of systems (except as listed in HPI above).  Objective:  General: Appears her stated age, well developed, well nourished in NAD. Skin: Warm, dry and intact. No rashes, lesions or ulcerations noted. HEENT: Head: normal shape and size; Eyes: sclera white, no icterus, conjunctiva pink, PERRLA and EOMs intact;  Cardiovascular:  Normal rate and rhythm. S1,S2 noted.  No murmur, rubs or gallops noted. No BLE edema. No carotid bruits noted. Pulmonary/Chest: Normal effort and positive vesicular breath sounds. No respiratory distress. No wheezes, rales or ronchi noted.  Neurological: Alert and oriented.  Psychiatric: Mood and affect normal. Behavior is normal. Judgment and thought content normal.   Assessment and Plan:

## 2015-08-22 NOTE — Assessment & Plan Note (Signed)
Continue Kenalog prn Avoid hot showers or baths Use a moisturizing lotion at least daily

## 2015-08-22 NOTE — Assessment & Plan Note (Signed)
Resolved with Flonase prn

## 2015-08-22 NOTE — Assessment & Plan Note (Signed)
She is using Nicoderm patches Support offered today She is being referred for lung cancer screening

## 2015-08-22 NOTE — Assessment & Plan Note (Signed)
Will check CBC today.  

## 2015-08-22 NOTE — Progress Notes (Signed)
Pre visit review using our clinic review tool, if applicable. No additional management support is needed unless otherwise documented below in the visit note. 

## 2015-08-22 NOTE — Assessment & Plan Note (Signed)
Will check CMET and Lipid Profile today Encouraged her to consume a low fat diet Will adjust dose of Lipitor if needed

## 2015-08-22 NOTE — Addendum Note (Signed)
Addended by: Marchia Bond on: 08/22/2015 01:28 PM   Modules accepted: Miquel Dunn

## 2015-08-23 LAB — HIV ANTIBODY (ROUTINE TESTING W REFLEX): HIV 1&2 Ab, 4th Generation: NONREACTIVE

## 2015-08-23 LAB — HEPATITIS C ANTIBODY: HCV Ab: NEGATIVE

## 2015-09-02 ENCOUNTER — Telehealth: Payer: Self-pay | Admitting: Acute Care

## 2015-09-02 NOTE — Telephone Encounter (Signed)
Patient does not meet qualifications for the program. This program requires patient to be at least 52 years old and patient is 54 years old.

## 2015-09-13 ENCOUNTER — Telehealth: Payer: Self-pay

## 2015-09-13 ENCOUNTER — Other Ambulatory Visit: Payer: Self-pay | Admitting: Medical

## 2015-09-13 NOTE — Telephone Encounter (Signed)
-----   Message from Jearld Fenton, NP sent at 08/23/2015 11:00 AM EST ----- Regarding: FW: LDCT screening for Lung Cancer Call pt:  She can not do the lung cancer screening until she is 9, but the good news is, that even if she quits smoking, she can do it at that age. ----- Message -----    From: Magdalen Spatz, NP    Sent: 08/22/2015   4:51 PM      To: Jearld Fenton, NP Subject: LDCT screening for Lung Cancer                 Dr. Garnette Gunner,  Ms. Body, is not yet 54 years old, and therefore does not qualify for the lung cancer screening program. Please re-refer her when she is 54 years old.   I am sorry we cannot screen her this year, but look forward to working with her when she is 54 years old.  Thanks so much for the referral,  Eric Form, NP

## 2015-09-13 NOTE — Telephone Encounter (Signed)
Left detailed msg on VM per HIPAA  

## 2015-09-13 NOTE — Telephone Encounter (Signed)
Is this ok to refill?  

## 2015-09-27 ENCOUNTER — Telehealth: Payer: Self-pay

## 2015-09-27 NOTE — Telephone Encounter (Signed)
Pt said eczema on feet has worsened;is covering larger area and itching more. Pt request stronger med that Kenalog cream. [facility] [address].

## 2015-09-27 NOTE — Telephone Encounter (Signed)
Left detailed msg on VM per HIPAA  

## 2015-09-27 NOTE — Telephone Encounter (Signed)
I would recommend she see a podiatrist, is she okay with referral.

## 2015-11-11 ENCOUNTER — Other Ambulatory Visit: Payer: Self-pay | Admitting: Internal Medicine

## 2015-11-11 ENCOUNTER — Telehealth: Payer: Self-pay | Admitting: Internal Medicine

## 2015-11-11 DIAGNOSIS — L309 Dermatitis, unspecified: Secondary | ICD-10-CM

## 2015-11-11 NOTE — Telephone Encounter (Signed)
Referral placed.

## 2015-11-11 NOTE — Telephone Encounter (Signed)
Pt called wanting to get a referral ecezema

## 2015-11-15 ENCOUNTER — Other Ambulatory Visit: Payer: Self-pay | Admitting: Internal Medicine

## 2016-01-03 ENCOUNTER — Other Ambulatory Visit: Payer: Self-pay

## 2016-01-03 DIAGNOSIS — Z1231 Encounter for screening mammogram for malignant neoplasm of breast: Secondary | ICD-10-CM

## 2016-02-27 ENCOUNTER — Ambulatory Visit
Admission: RE | Admit: 2016-02-27 | Discharge: 2016-02-27 | Disposition: A | Discharge status: Home / Self Care | Payer: No Typology Code available for payment source | Source: Ambulatory Visit

## 2016-02-27 DIAGNOSIS — Z1231 Encounter for screening mammogram for malignant neoplasm of breast: Secondary | ICD-10-CM

## 2016-03-03 ENCOUNTER — Telehealth: Payer: Self-pay | Admitting: Hematology and Oncology

## 2016-03-03 NOTE — Telephone Encounter (Signed)
S.w pt and advised on appt moved to earlier...the patient ok and aware

## 2016-03-06 ENCOUNTER — Encounter (HOSPITAL_COMMUNITY): Payer: Self-pay

## 2016-03-06 ENCOUNTER — Ambulatory Visit (HOSPITAL_COMMUNITY)
Admission: RE | Admit: 2016-03-06 | Discharge: 2016-03-06 | Disposition: A | Discharge status: Home / Self Care | Payer: No Typology Code available for payment source | Source: Ambulatory Visit | Attending: Hematology and Oncology | Admitting: Hematology and Oncology

## 2016-03-06 ENCOUNTER — Other Ambulatory Visit (HOSPITAL_BASED_OUTPATIENT_CLINIC_OR_DEPARTMENT_OTHER): Payer: No Typology Code available for payment source

## 2016-03-06 DIAGNOSIS — R911 Solitary pulmonary nodule: Secondary | ICD-10-CM

## 2016-03-06 DIAGNOSIS — I700 Atherosclerosis of aorta: Secondary | ICD-10-CM | POA: Insufficient documentation

## 2016-03-06 DIAGNOSIS — K449 Diaphragmatic hernia without obstruction or gangrene: Secondary | ICD-10-CM | POA: Insufficient documentation

## 2016-03-06 DIAGNOSIS — D75839 Thrombocytosis, unspecified: Secondary | ICD-10-CM

## 2016-03-06 DIAGNOSIS — R590 Localized enlarged lymph nodes: Secondary | ICD-10-CM | POA: Diagnosis not present

## 2016-03-06 DIAGNOSIS — D473 Essential (hemorrhagic) thrombocythemia: Secondary | ICD-10-CM | POA: Diagnosis not present

## 2016-03-06 LAB — CBC WITH DIFFERENTIAL/PLATELET
BASO%: 0.5 % (ref 0.0–2.0)
BASOS ABS: 0.1 10*3/uL (ref 0.0–0.1)
EOS%: 1.3 % (ref 0.0–7.0)
Eosinophils Absolute: 0.1 10*3/uL (ref 0.0–0.5)
HCT: 42 % (ref 34.8–46.6)
HEMOGLOBIN: 14 g/dL (ref 11.6–15.9)
LYMPH%: 28.2 % (ref 14.0–49.7)
MCH: 28.7 pg (ref 25.1–34.0)
MCHC: 33.3 g/dL (ref 31.5–36.0)
MCV: 86.2 fL (ref 79.5–101.0)
MONO#: 0.5 10*3/uL (ref 0.1–0.9)
MONO%: 5.1 % (ref 0.0–14.0)
NEUT#: 6.8 10*3/uL — ABNORMAL HIGH (ref 1.5–6.5)
NEUT%: 64.9 % (ref 38.4–76.8)
Platelets: 544 10*3/uL — ABNORMAL HIGH (ref 145–400)
RBC: 4.87 10*6/uL (ref 3.70–5.45)
RDW: 13.8 % (ref 11.2–14.5)
WBC: 10.5 10*3/uL — AB (ref 3.9–10.3)
lymph#: 3 10*3/uL (ref 0.9–3.3)

## 2016-03-06 LAB — COMPREHENSIVE METABOLIC PANEL
ALBUMIN: 3.8 g/dL (ref 3.5–5.0)
ALT: 20 U/L (ref 0–55)
AST: 20 U/L (ref 5–34)
Alkaline Phosphatase: 123 U/L (ref 40–150)
Anion Gap: 10 mEq/L (ref 3–11)
BUN: 10.1 mg/dL (ref 7.0–26.0)
CHLORIDE: 107 meq/L (ref 98–109)
CO2: 25 meq/L (ref 22–29)
Calcium: 9.7 mg/dL (ref 8.4–10.4)
Creatinine: 0.7 mg/dL (ref 0.6–1.1)
GLUCOSE: 93 mg/dL (ref 70–140)
POTASSIUM: 4.1 meq/L (ref 3.5–5.1)
SODIUM: 142 meq/L (ref 136–145)
Total Bilirubin: 0.5 mg/dL (ref 0.20–1.20)
Total Protein: 7.9 g/dL (ref 6.4–8.3)

## 2016-03-06 MED ORDER — IOPAMIDOL (ISOVUE-300) INJECTION 61%
75.0000 mL | Freq: Once | INTRAVENOUS | Status: AC | PRN
  Administered 2016-03-06: 75 mL via INTRAVENOUS

## 2016-03-09 ENCOUNTER — Other Ambulatory Visit: Payer: PRIVATE HEALTH INSURANCE

## 2016-03-09 ENCOUNTER — Encounter: Payer: Self-pay | Admitting: Hematology and Oncology

## 2016-03-09 ENCOUNTER — Ambulatory Visit: Payer: No Typology Code available for payment source | Admitting: Hematology and Oncology

## 2016-03-10 ENCOUNTER — Encounter: Payer: Self-pay | Admitting: *Deleted

## 2016-03-10 NOTE — Progress Notes (Signed)
No Show Letter from Dr. Alvy Bimler placed in outgoing mail to pt's home address.

## 2016-04-02 ENCOUNTER — Other Ambulatory Visit: Payer: No Typology Code available for payment source

## 2016-04-02 ENCOUNTER — Encounter: Payer: Self-pay | Admitting: Hematology and Oncology

## 2016-04-02 ENCOUNTER — Ambulatory Visit (HOSPITAL_BASED_OUTPATIENT_CLINIC_OR_DEPARTMENT_OTHER): Payer: No Typology Code available for payment source | Admitting: Hematology and Oncology

## 2016-04-02 VITALS — BP 123/88 | HR 70 | Temp 98.2°F | Resp 18 | Ht 65.0 in | Wt 178.7 lb

## 2016-04-02 DIAGNOSIS — D473 Essential (hemorrhagic) thrombocythemia: Secondary | ICD-10-CM | POA: Diagnosis not present

## 2016-04-02 DIAGNOSIS — D75839 Thrombocytosis, unspecified: Secondary | ICD-10-CM

## 2016-04-02 DIAGNOSIS — F17200 Nicotine dependence, unspecified, uncomplicated: Secondary | ICD-10-CM

## 2016-04-02 DIAGNOSIS — D72829 Elevated white blood cell count, unspecified: Secondary | ICD-10-CM

## 2016-04-02 DIAGNOSIS — R911 Solitary pulmonary nodule: Secondary | ICD-10-CM

## 2016-04-02 DIAGNOSIS — Z720 Tobacco use: Secondary | ICD-10-CM | POA: Diagnosis not present

## 2016-04-02 NOTE — Progress Notes (Signed)
[facility] OFFICE PROGRESS NOTE  Patient Care Team: Jearld Fenton, NP as PCP - General (Internal Medicine) Heath Lark, MD as Consulting Physician (Hematology and Oncology)  SUMMARY OF ONCOLOGIC HISTORY:  She was found to have abnormal CBC from blood draw from her primary care physician office that was ordered due to her symptoms. Her platelet count ranges from 600-900,000. She complained of altered perception/numbness of her hands and feet, worse in her hands. She say that she felt some cramps in the muscles. She was started on Neurontin for his. She denies skin itching. Denies any discoloration of her hands or feet. She have chronic sweating but she attributed to menopausal symptoms since the complete hysterectomy recently. In July 2015, peripheral blood for JAK2 mutation, BCR/ABL and MPL mutation were all negative. She is placed on aspirin therapy and observation  INTERVAL HISTORY: Please see below for problem oriented charting. She feels well. She is attempting to quit smoking and down to less than 5 cigarettes per day. She denies recent diagnosis of blood clot. She denies side effects from aspirin therapy. She has mild skin eczema, responding to topical cream. She has chronic nonproductive cough from smoking. REVIEW OF SYSTEMS:   Constitutional: Denies fevers, chills or abnormal weight loss Eyes: Denies blurriness of vision Ears, nose, mouth, throat, and face: Denies mucositis or sore throat Cardiovascular: Denies palpitation, chest discomfort or lower extremity swelling Gastrointestinal:  Denies nausea, heartburn or change in bowel habits Lymphatics: Denies new lymphadenopathy or easy bruising Neurological:Denies numbness, tingling or new weaknesses Behavioral/Psych: Mood is stable, no new changes  All other systems were reviewed with the patient and are negative.  I have reviewed the past medical history, past surgical history, social history and family  history with the patient and they are unchanged from previous note.  ALLERGIES:  has No Known Allergies.  MEDICATIONS:  Current Outpatient Prescriptions  Medication Sig Dispense Refill  . aspirin 81 MG tablet Take 81 mg by mouth daily.    Marland Kitchen atorvastatin (LIPITOR) 20 MG tablet TAKE 1 TABLET BY MOUTH EVERY DAY 30 tablet 9  . Multiple Vitamins-Minerals (MULTIVITAMIN WITH MINERALS) tablet Take 1 tablet by mouth daily.    Marland Kitchen triamcinolone cream (KENALOG) 0.1 % Apply topically 2 (two) times daily. 30 g 2  . varenicline (CHANTIX STARTING MONTH PAK) 0.5 MG X 11 & 1 MG X 42 tablet Take one 0.5 mg tablet by mouth once daily for 3 days, then increase to one 0.5 mg tablet twice daily for 4 days, then increase to one 1 mg tablet twice daily. 53 tablet 0  . venlafaxine (EFFEXOR) 37.5 MG tablet Take 1 tablet (37.5 mg total) by mouth 2 (two) times daily with a meal. 60 tablet 11   No current facility-administered medications for this visit.    PHYSICAL EXAMINATION: ECOG PERFORMANCE STATUS: 0 - Asymptomatic  Filed Vitals:   04/02/16 1217  BP: 123/88  Pulse: 70  Temp: 98.2 F (36.8 C)  Resp: 18   Filed Weights   04/02/16 1217  Weight: 178 lb 11.2 oz (81.058 kg)    GENERAL:alert, no distress and comfortable SKIN: . Noted mild eczema on the skin EYES: normal, Conjunctiva are pink and non-injected, sclera clear Musculoskeletal:no cyanosis of digits and no clubbing  NEURO: alert & oriented x 3 with fluent speech, no focal motor/sensory deficits  LABORATORY DATA:  I have reviewed the data as listed    Component Value Date/Time   NA 142 03/06/2016 1031   NA  141 08/22/2015 1037   NA 138 02/08/2014 2210   K 4.1 03/06/2016 1031   K 3.7 08/22/2015 1037   K 3.5 02/08/2014 2210   CL 106 08/22/2015 1037   CL 107 02/08/2014 2210   CO2 25 03/06/2016 1031   CO2 27 08/22/2015 1037   CO2 25 02/08/2014 2210   GLUCOSE 93 03/06/2016 1031   GLUCOSE 97 08/22/2015 1037   GLUCOSE 110* 02/08/2014 2210    BUN 10.1 03/06/2016 1031   BUN 10 08/22/2015 1037   BUN 16 02/08/2014 2210   CREATININE 0.7 03/06/2016 1031   CREATININE 0.56 08/22/2015 1037   CREATININE 0.68 02/08/2014 2210   CREATININE 0.50 09/27/2012 1057   CALCIUM 9.7 03/06/2016 1031   CALCIUM 10.0 08/22/2015 1037   CALCIUM 9.5 02/08/2014 2210   PROT 7.9 03/06/2016 1031   PROT 7.5 08/22/2015 1037   ALBUMIN 3.8 03/06/2016 1031   ALBUMIN 4.2 08/22/2015 1037   AST 20 03/06/2016 1031   AST 21 08/22/2015 1037   ALT 20 03/06/2016 1031   ALT 17 08/22/2015 1037   ALKPHOS 123 03/06/2016 1031   ALKPHOS 125* 08/22/2015 1037   BILITOT 0.50 03/06/2016 1031   BILITOT 0.5 08/22/2015 1037   GFRNONAA >60 02/08/2014 2210   GFRAA >60 02/08/2014 2210    No results found for: SPEP, UPEP  Lab Results  Component Value Date   WBC 10.5* 03/06/2016   NEUTROABS 6.8* 03/06/2016   HGB 14.0 03/06/2016   HCT 42.0 03/06/2016   MCV 86.2 03/06/2016   PLT 544* 03/06/2016      Chemistry      Component Value Date/Time   NA 142 03/06/2016 1031   NA 141 08/22/2015 1037   NA 138 02/08/2014 2210   K 4.1 03/06/2016 1031   K 3.7 08/22/2015 1037   K 3.5 02/08/2014 2210   CL 106 08/22/2015 1037   CL 107 02/08/2014 2210   CO2 25 03/06/2016 1031   CO2 27 08/22/2015 1037   CO2 25 02/08/2014 2210   BUN 10.1 03/06/2016 1031   BUN 10 08/22/2015 1037   BUN 16 02/08/2014 2210   CREATININE 0.7 03/06/2016 1031   CREATININE 0.56 08/22/2015 1037   CREATININE 0.68 02/08/2014 2210   CREATININE 0.50 09/27/2012 1057      Component Value Date/Time   CALCIUM 9.7 03/06/2016 1031   CALCIUM 10.0 08/22/2015 1037   CALCIUM 9.5 02/08/2014 2210   ALKPHOS 123 03/06/2016 1031   ALKPHOS 125* 08/22/2015 1037   AST 20 03/06/2016 1031   AST 21 08/22/2015 1037   ALT 20 03/06/2016 1031   ALT 17 08/22/2015 1037   BILITOT 0.50 03/06/2016 1031   BILITOT 0.5 08/22/2015 1037       RADIOGRAPHIC STUDIES:I reviewed the CT scan  I have personally reviewed the  radiological images as listed and agreed with the findings in the report.    ASSESSMENT & PLAN:  Thrombocytosis All the workup for myeloproliferative disorder came back negative. This would still not exclude possible diagnosis of essential thrombocytosis. I discussed with the patient and her husband I am not keen to perform bone marrow aspirate and biopsy right now. In the absence of history of thrombosis, she would not need to be started on chemotherapy. I recommend she continue on aspirin 81 mg daily. The fact that the thrombocytosis improved when she cut back on her cigarette smoking also suggested a reactive component to it.   Current smoker I spent some time counseling the patient the importance  of tobacco cessation. she is currently attempting to quit on her own    Leukocytosis Leukocytosis improved when she cut back on smoking. This is just to reactive leukocytosis likely due to mild COPD  Solitary lung nodule CT scan shows stable lung nodule. Observe only.     Orders Placed This Encounter  Procedures  . CBC with Differential/Platelet    Standing Status: Future     Number of Occurrences:      Standing Expiration Date: 05/07/2017   All questions were answered. The patient knows to call the clinic with any problems, questions or concerns. No barriers to learning was detected. I spent 15 minutes counseling the patient face to face. The total time spent in the appointment was 20 minutes and more than 50% was on counseling and review of test results     [facility], Brienne Liguori, MD 04/02/2016 4:05 PM

## 2016-04-03 ENCOUNTER — Telehealth: Payer: Self-pay | Admitting: Hematology and Oncology

## 2016-04-03 DIAGNOSIS — D72829 Elevated white blood cell count, unspecified: Secondary | ICD-10-CM | POA: Insufficient documentation

## 2016-04-03 NOTE — Assessment & Plan Note (Signed)
All the workup for myeloproliferative disorder came back negative. This would still not exclude possible diagnosis of essential thrombocytosis. I discussed with the patient and her husband I am not keen to perform bone marrow aspirate and biopsy right now. In the absence of history of thrombosis, she would not need to be started on chemotherapy. I recommend she continue on aspirin 81 mg daily. The fact that the thrombocytosis improved when she cut back on her cigarette smoking also suggested a reactive component to it.

## 2016-04-03 NOTE — Telephone Encounter (Signed)
left msg confirming apt in 1 year

## 2016-04-03 NOTE — Assessment & Plan Note (Signed)
I spent some time counseling the patient the importance of tobacco cessation. she is currently attempting to quit on her own 

## 2016-04-03 NOTE — Assessment & Plan Note (Signed)
Leukocytosis improved when she cut back on smoking. This is just to reactive leukocytosis likely due to mild COPD

## 2016-04-03 NOTE — Assessment & Plan Note (Signed)
CT scan shows stable lung nodule. Observe only.

## 2016-08-24 ENCOUNTER — Encounter: Payer: Self-pay | Admitting: Internal Medicine

## 2016-08-24 ENCOUNTER — Encounter: Payer: No Typology Code available for payment source | Admitting: Internal Medicine

## 2016-08-24 ENCOUNTER — Ambulatory Visit (INDEPENDENT_AMBULATORY_CARE_PROVIDER_SITE_OTHER): Payer: No Typology Code available for payment source | Admitting: Internal Medicine

## 2016-08-24 VITALS — BP 120/78 | HR 81 | Temp 98.7°F | Ht 65.0 in | Wt 183.0 lb

## 2016-08-24 DIAGNOSIS — Z0001 Encounter for general adult medical examination with abnormal findings: Secondary | ICD-10-CM

## 2016-08-24 DIAGNOSIS — Z23 Encounter for immunization: Secondary | ICD-10-CM

## 2016-08-24 DIAGNOSIS — L2082 Flexural eczema: Secondary | ICD-10-CM | POA: Diagnosis not present

## 2016-08-24 DIAGNOSIS — D473 Essential (hemorrhagic) thrombocythemia: Secondary | ICD-10-CM

## 2016-08-24 DIAGNOSIS — E7800 Pure hypercholesterolemia, unspecified: Secondary | ICD-10-CM

## 2016-08-24 DIAGNOSIS — D75839 Thrombocytosis, unspecified: Secondary | ICD-10-CM

## 2016-08-24 DIAGNOSIS — F17200 Nicotine dependence, unspecified, uncomplicated: Secondary | ICD-10-CM | POA: Diagnosis not present

## 2016-08-24 DIAGNOSIS — B356 Tinea cruris: Secondary | ICD-10-CM

## 2016-08-24 LAB — COMPREHENSIVE METABOLIC PANEL
ALK PHOS: 106 U/L (ref 39–117)
ALT: 19 U/L (ref 0–35)
AST: 19 U/L (ref 0–37)
Albumin: 4.1 g/dL (ref 3.5–5.2)
BUN: 12 mg/dL (ref 6–23)
CHLORIDE: 104 meq/L (ref 96–112)
CO2: 27 mEq/L (ref 19–32)
Calcium: 9.6 mg/dL (ref 8.4–10.5)
Creatinine, Ser: 0.64 mg/dL (ref 0.40–1.20)
GFR: 124.58 mL/min (ref >60.00)
GLUCOSE: 146 mg/dL — AB (ref 70–99)
POTASSIUM: 3.5 meq/L (ref 3.5–5.1)
SODIUM: 140 meq/L (ref 135–145)
TOTAL PROTEIN: 7.4 g/dL (ref 6.0–8.3)
Total Bilirubin: 0.4 mg/dL (ref 0.2–1.2)

## 2016-08-24 LAB — CBC
HEMATOCRIT: 38.8 % (ref 36.0–46.0)
Hemoglobin: 12.8 g/dL (ref 12.0–15.0)
MCHC: 33 g/dL (ref 30.0–36.0)
MCV: 86.4 fl (ref 78.0–100.0)
Platelets: 656 10*3/uL — ABNORMAL HIGH (ref 150.0–400.0)
RBC: 4.5 Mil/uL (ref 3.87–5.11)
RDW: 14.4 % (ref 11.5–15.5)
WBC: 11.9 10*3/uL — AB (ref 4.0–10.5)

## 2016-08-24 LAB — LIPID PANEL
Cholesterol: 174 mg/dL (ref 0–200)
HDL: 38.1 mg/dL — ABNORMAL LOW (ref >39.00)
LDL CALC: 112 mg/dL — AB (ref 0–99)
NONHDL: 136.01
Total CHOL/HDL Ratio: 5
Triglycerides: 121 mg/dL (ref 0.0–149.0)
VLDL: 24.2 mg/dL (ref 0.0–40.0)

## 2016-08-24 MED ORDER — TRIAMCINOLONE ACETONIDE 0.1 % EX CREA: TOPICAL_CREAM | Freq: Two times a day (BID) | CUTANEOUS | 2 refills | Status: DC

## 2016-08-24 MED ORDER — CICLOPIROX OLAMINE 0.77 % EX CREA: TOPICAL_CREAM | Freq: Two times a day (BID) | CUTANEOUS | 0 refills | Status: DC

## 2016-08-24 NOTE — Assessment & Plan Note (Signed)
Encouraged her to consume a low fat diet Lipid profile and CMET today Continue Lipitor.

## 2016-08-24 NOTE — Assessment & Plan Note (Signed)
Continue to avoid hot water/showers Continue moisturizing lotion daily Refilled Kenalog cream

## 2016-08-24 NOTE — Progress Notes (Signed)
Subjective:    Patient ID: Tamara Espinoza, female    DOB: 12/02/1960, 55 y.o.   MRN: [id number]  HPI  Pt presents to the clinic today for her annual exam. She is also due for follow up of chronic conditions.  Eczema: This usually affects her hands and wrist. She does apply moisturizing cream daily. She uses Kenalog cream prn when it gets really bad. She is requesting a refill of this today.  HLD: She is taking Lipitor as prescribed and denies myalgias. She does consume some fried food.  Thrombocytosis: Asymptomatic. She follows with Dr. Alvy Bimler. Note from 03/2016 reviewed.  Smoker: She is currently smoking 1/2 ppd. She has failed Chantix and Nicoderm patches. She is not interested in quitting at this time.  Flu: 05/2015 Tetanus: 09/2012 Pneumovax: 09/2012 Pap Smear: 09/2012 Mammogram: 02/2016 Colon Screening: 10/2014 Vision Screening: yearly Dentist: as needed  Diet: She consumes meat. She eats veggies daily. She does not consume fruit but a few times per week. She does eat some fried food. She drinks mostly water. Exercise: She has not been to the gym in 2 months, no exercise right now.  Review of Systems      Past Medical History:  Diagnosis Date  . Anemia   . Chicken pox   . Chronic cough 06/17/2015  . Eczema   . Mood changes ([place])   . Thrombocytosis ([place]) 04/05/2014    Current Outpatient Prescriptions  Medication Sig Dispense Refill  . aspirin 81 MG tablet Take 81 mg by mouth daily.    Marland Kitchen atorvastatin (LIPITOR) 20 MG tablet TAKE 1 TABLET BY MOUTH EVERY DAY 30 tablet 9  . Multiple Vitamins-Minerals (MULTIVITAMIN WITH MINERALS) tablet Take 1 tablet by mouth daily.    Marland Kitchen triamcinolone cream (KENALOG) 0.1 % Apply topically 2 (two) times daily. 30 g 2  . varenicline (CHANTIX STARTING MONTH PAK) 0.5 MG X 11 & 1 MG X 42 tablet Take one 0.5 mg tablet by mouth once daily for 3 days, then increase to one 0.5 mg tablet twice daily for 4 days, then increase to one 1 mg tablet twice  daily. 53 tablet 0  . venlafaxine (EFFEXOR) 37.5 MG tablet Take 1 tablet (37.5 mg total) by mouth 2 (two) times daily with a meal. 60 tablet 11   No current facility-administered medications for this visit.     No Known Allergies  Family History  Problem Relation Age of Onset  . Cancer Mother 52    colon  . Hypertension Mother   . Colon cancer Mother   . Other Father     suicide  . Heart disease Father     ?  Marland Kitchen Hypertension Sister   . Stroke Neg Hx   . Diabetes Neg Hx   . Stomach cancer Neg Hx   . Cancer Paternal Grandmother     lung ca    Social History   Social History  . Marital status: Married    Spouse name: N/A  . Number of children: N/A  . Years of education: N/A   Occupational History  . Not on file.   Social History Main Topics  . Smoking status: Current Every Day Smoker    Packs/day: 0.50    Years: 35.00    Types: Cigarettes  . Smokeless tobacco: Never Used  . Alcohol use 2.4 oz/week    4 Standard drinks or equivalent per week     Comment: occasional  . Drug use:     Types:  Marijuana     Comment: 2 puffs per day  . Sexual activity: Yes   Other Topics Concern  . Not on file   Social History Narrative   Married, 2 children, unemployed currently, living in [place], but coming here for care, exercise calisthenics, walks in warm weather     Constitutional: Denies fever, malaise, fatigue, headache or abrupt weight changes.  HEENT: Denies eye pain, eye redness, ear pain, ringing in the ears, wax buildup, runny nose, nasal congestion, bloody nose, or sore throat. Respiratory: Denies difficulty breathing, shortness of breath, cough or sputum production.   Cardiovascular: Denies chest pain, chest tightness, palpitations or swelling in the hands or feet.  Gastrointestinal: Pt reports accessional constipation. Denies abdominal pain, bloating, constipation, diarrhea or blood in the stool.  GU: Denies urgency, frequency, pain with urination, burning  sensation, blood in urine, odor or discharge. Musculoskeletal: Denies decrease in range of motion, difficulty with gait, muscle pain or joint pain and swelling.  Skin: Pt reports rash on her lower abdomen. Denies redness, lesions or ulcercations.  Neurological: Denies dizziness, difficulty with memory, difficulty with speech or problems with balance and coordination.  Psych: Denies depression, anxiety,SI/HI.  No other specific complaints in a complete review of systems (except as listed in HPI above).  Objective:   Physical Exam BP 120/78   Pulse 81   Temp 98.7 F (37.1 C) (Oral)   Ht 5\' 5"  (1.651 m)   Wt 183 lb (83 kg)   SpO2 98%   BMI 30.45 kg/m   Wt Readings from Last 3 Encounters:  04/02/16 178 lb 11.2 oz (81.1 kg)  08/22/15 177 lb (80.3 kg)  10/29/14 183 lb (83 kg)    General: Appears her stated age, well developed, well nourished in NAD. Skin: Warm, dry and intact. Tinea cruris noted under abdominal fold. HEENT: Head: normal shape and size; Eyes: sclera white, no icterus, conjunctiva pink, PERRLA and EOMs intact; Ears: Tm's gray and intact, normal light reflex; Throat/Mouth: Teeth present, mucosa pink and moist, no exudate, lesions or ulcerations noted.  Neck:  Neck supple, trachea midline. No masses, lumps or thyromegaly present.  Cardiovascular: Normal rate and rhythm. S1,S2 noted.  No murmur, rubs or gallops noted. No BLE edema. No carotid bruits noted. Pulmonary/Chest: Normal effort and positive vesicular breath sounds. No respiratory distress. No wheezes, rales or ronchi noted.  Abdomen: Soft and nontender. Normal bowel sounds No distention or masses noted. Liver, spleen and kidneys non palpable. Musculoskeletal: Strength 5/45 BUE/BLE. No signs of joint swelling. No difficulty with gait.  Neurological: Alert and oriented. Cranial nerves II-XII grossly intact. Coordination normal.  Psychiatric: Mood and affect normal. Behavior is normal. Judgment and thought content  normal.     BMET    Component Value Date/Time   NA 142 03/06/2016 1031   K 4.1 03/06/2016 1031   CL 106 08/22/2015 1037   CL 107 02/08/2014 2210   CO2 25 03/06/2016 1031   GLUCOSE 93 03/06/2016 1031   BUN 10.1 03/06/2016 1031   CREATININE 0.7 03/06/2016 1031   CALCIUM 9.7 03/06/2016 1031   GFRNONAA >60 02/08/2014 2210   GFRAA >60 02/08/2014 2210    Lipid Panel     Component Value Date/Time   CHOL 184 08/22/2015 1037   TRIG 81.0 08/22/2015 1037   HDL 41.50 08/22/2015 1037   CHOLHDL 4 08/22/2015 1037   VLDL 16.2 08/22/2015 1037   LDLCALC 126 (H) 08/22/2015 1037    CBC    Component Value Date/Time  WBC 10.5 (H) 03/06/2016 1031   WBC 11.1 (H) 08/22/2015 1037   RBC 4.87 03/06/2016 1031   RBC 4.68 08/22/2015 1037   HGB 14.0 03/06/2016 1031   HCT 42.0 03/06/2016 1031   PLT 544 (H) 03/06/2016 1031   MCV 86.2 03/06/2016 1031   MCH 28.7 03/06/2016 1031   MCH 29.0 03/20/2014 1219   MCHC 33.3 03/06/2016 1031   MCHC 32.3 08/22/2015 1037   RDW 13.8 03/06/2016 1031   LYMPHSABS 3.0 03/06/2016 1031   MONOABS 0.5 03/06/2016 1031   EOSABS 0.1 03/06/2016 1031   BASOSABS 0.1 03/06/2016 1031    Hgb A1C Lab Results  Component Value Date   HGBA1C 6.3 08/22/2015         Assessment & Plan:   Preventative Health Maintenance  Flu shot today Tetanus and pneumovax She does not get pap smears anymore due to hysterectomy Mammogram UTD Encouraged her to consume a low fat diet and exercise program Advised her to see an eye doctor and dentist annually Will check CBC, CMET, Lipid profile today  Tinea Cruris:  eRx for Ciclopirox cream BID x 4 weeks  RTC in 1 year for annual exam/followup Webb Silversmith, NP

## 2016-08-24 NOTE — Assessment & Plan Note (Signed)
Discussed smoking cessation, she is not ready at this time

## 2016-08-24 NOTE — Patient Instructions (Signed)

## 2016-08-24 NOTE — Assessment & Plan Note (Signed)
She will continue to follow with hematology 

## 2016-08-26 NOTE — Addendum Note (Signed)
Addended by: Lurlean Nanny on: 08/26/2016 10:19 AM   Modules accepted: Orders

## 2016-09-09 ENCOUNTER — Other Ambulatory Visit: Payer: Self-pay | Admitting: Internal Medicine

## 2017-01-23 ENCOUNTER — Other Ambulatory Visit: Payer: Self-pay | Admitting: Internal Medicine

## 2017-01-23 DIAGNOSIS — L2082 Flexural eczema: Secondary | ICD-10-CM

## 2017-01-25 NOTE — Telephone Encounter (Signed)
Last filled 08/2016 with 2 refills... Please advise

## 2017-01-27 ENCOUNTER — Other Ambulatory Visit: Payer: No Typology Code available for payment source

## 2017-01-27 ENCOUNTER — Encounter: Payer: Self-pay | Admitting: Hematology and Oncology

## 2017-01-27 ENCOUNTER — Ambulatory Visit: Payer: No Typology Code available for payment source | Admitting: Hematology and Oncology

## 2017-02-17 ENCOUNTER — Other Ambulatory Visit: Payer: Self-pay | Admitting: Internal Medicine

## 2017-02-17 DIAGNOSIS — Z1231 Encounter for screening mammogram for malignant neoplasm of breast: Secondary | ICD-10-CM

## 2017-03-01 ENCOUNTER — Ambulatory Visit
Admission: RE | Admit: 2017-03-01 | Discharge: 2017-03-01 | Disposition: A | Discharge status: Home / Self Care | Payer: No Typology Code available for payment source | Source: Ambulatory Visit | Attending: Internal Medicine | Admitting: Internal Medicine

## 2017-03-01 DIAGNOSIS — Z1231 Encounter for screening mammogram for malignant neoplasm of breast: Secondary | ICD-10-CM

## 2017-03-02 ENCOUNTER — Ambulatory Visit: Payer: No Typology Code available for payment source

## 2017-03-23 ENCOUNTER — Other Ambulatory Visit: Payer: Self-pay | Admitting: Internal Medicine

## 2017-08-26 ENCOUNTER — Encounter: Payer: No Typology Code available for payment source | Admitting: Internal Medicine

## 2020-06-05 LAB — BASIC METABOLIC PANEL
BUN: 11 (ref 4–21)
CO2: 18 (ref 13–22)
Chloride: 106 (ref 99–108)
Creatinine: 0.7 (ref 0.5–1.1)
Glucose: 81
Potassium: 4.6 (ref 3.4–5.3)
Sodium: 143 (ref 137–147)

## 2020-06-05 LAB — LIPID PANEL
Cholesterol: 211 — AB (ref 0–200)
HDL: 44 (ref 35–70)
LDL Cholesterol: 145
LDl/HDL Ratio: 3.3
Triglycerides: 120 (ref 40–160)

## 2020-06-05 LAB — CBC AND DIFFERENTIAL
HCT: 42 (ref 36–46)
Hemoglobin: 14.1 (ref 12.0–16.0)
Platelets: 693 — AB (ref 150–399)
WBC: 11.6

## 2020-06-05 LAB — TSH: TSH: 0.73 (ref 0.41–5.90)

## 2020-06-05 LAB — CBC: RBC: 4.77 (ref 3.87–5.11)

## 2020-06-05 LAB — HEPATIC FUNCTION PANEL
ALT: 12 (ref 7–35)
AST: 16 (ref 13–35)
Alkaline Phosphatase: 134 — AB (ref 25–125)
Bilirubin, Total: 0.4

## 2020-06-05 LAB — COMPREHENSIVE METABOLIC PANEL: Calcium: 10 (ref 8.7–10.7)

## 2020-06-17 LAB — HM MAMMOGRAPHY

## 2021-06-27 ENCOUNTER — Ambulatory Visit (INDEPENDENT_AMBULATORY_CARE_PROVIDER_SITE_OTHER): Payer: No Typology Code available for payment source

## 2021-06-27 ENCOUNTER — Ambulatory Visit (HOSPITAL_COMMUNITY)
Admission: EM | Admit: 2021-06-27 | Discharge: 2021-06-27 | Disposition: A | Discharge status: Home / Self Care | Payer: No Typology Code available for payment source | Attending: Urgent Care | Admitting: Urgent Care

## 2021-06-27 ENCOUNTER — Encounter (HOSPITAL_COMMUNITY): Payer: Self-pay | Admitting: Emergency Medicine

## 2021-06-27 ENCOUNTER — Other Ambulatory Visit: Payer: Self-pay

## 2021-06-27 DIAGNOSIS — R0989 Other specified symptoms and signs involving the circulatory and respiratory systems: Secondary | ICD-10-CM

## 2021-06-27 DIAGNOSIS — R059 Cough, unspecified: Secondary | ICD-10-CM | POA: Diagnosis not present

## 2021-06-27 DIAGNOSIS — R051 Acute cough: Secondary | ICD-10-CM

## 2021-06-27 DIAGNOSIS — J3089 Other allergic rhinitis: Secondary | ICD-10-CM

## 2021-06-27 DIAGNOSIS — F17200 Nicotine dependence, unspecified, uncomplicated: Secondary | ICD-10-CM

## 2021-06-27 MED ORDER — PREDNISONE 20 MG PO TABS: ORAL_TABLET | ORAL | 0 refills | Status: DC

## 2021-06-27 NOTE — ED Provider Notes (Signed)
[facility]   MRN: [id number] DOB: 04-27-1961  Subjective:   Tamara Espinoza is a 60 y.o. female presenting for 1.5 week history of persistent cough, chest congestion, throat congestion, sinus congestion.  Patient has not had any fever, sinus pain, hemoptysis, chest pain, shortness of breath.  She is a smoker, is trying to quit, currently does 1/2ppd.  Smokes marijuana once weekly. Would like to make sure she does not have pneumonia.  She does have allergies, is taken her allergy meds.  No current facility-administered medications for this encounter.  Current Outpatient Medications:    aspirin 81 MG tablet, Take 81 mg by mouth daily., Disp: , Rfl:    atorvastatin (LIPITOR) 20 MG tablet, TAKE 1 TABLET BY MOUTH EVERY DAY, Disp: 30 tablet, Rfl: 3   ciclopirox (LOPROX) 0.77 % cream, Apply topically 2 (two) times daily., Disp: 15 g, Rfl: 0   Multiple Vitamins-Minerals (MULTIVITAMIN WITH MINERALS) tablet, Take 1 tablet by mouth daily., Disp: , Rfl:    triamcinolone cream (KENALOG) 0.1 %, APPLY TOPICALLY 2 (TWO) TIMES DAILY., Disp: 30 g, Rfl: 2   No Known Allergies  Past Medical History:  Diagnosis Date   Anemia    Chicken pox    Chronic cough 06/17/2015   Eczema    Mood changes    Thrombocytosis 04/05/2014     Past Surgical History:  Procedure Laterality Date   ABDOMINAL HYSTERECTOMY     BREAST BIOPSY Right 06/27/2010   BREAST BIOPSY Left 02/06/2005   BREAST BIOPSY Bilateral 01/20/2001   BREAST EXCISIONAL BIOPSY Right 2011   BREAST EXCISIONAL BIOPSY  2006   CESAREAN SECTION     DILATION AND CURETTAGE OF UTERUS      Family History  Problem Relation Age of Onset   Cancer Mother 42       colon   Hypertension Mother    Colon cancer Mother    Other Father        suicide   Heart disease Father        ?   Hypertension Sister    Cancer Paternal Grandmother        lung ca   Stroke Neg Hx    Diabetes Neg Hx    Stomach cancer Neg Hx     Social History    Tobacco Use   Smoking status: Every Day    Packs/day: 0.50    Years: 35.00    Pack years: 17.50    Types: Cigarettes   Smokeless tobacco: Never  Substance Use Topics   Alcohol use: Yes    Alcohol/week: 4.0 standard drinks    Types: 4 Standard drinks or equivalent per week    Comment: occasional   Drug use: Yes    Types: Marijuana    Comment: 2 puffs per day    ROS   Objective:   Vitals: BP 137/72 (BP Location: Left Arm)   Pulse 71   Temp 97.8 F (36.6 C) (Oral)   Resp 16   SpO2 100%   Physical Exam Constitutional:      General: She is not in acute distress.    Appearance: Normal appearance. She is well-developed. She is not ill-appearing, toxic-appearing or diaphoretic.  HENT:     Head: Normocephalic and atraumatic.     Right Ear: External ear normal.     Left Ear: External ear normal.     Nose: Congestion present. No rhinorrhea.     Mouth/Throat:     Mouth:  Mucous membranes are moist.     Pharynx: No oropharyngeal exudate or posterior oropharyngeal erythema.  Eyes:     General: No scleral icterus.       Right eye: No discharge.        Left eye: No discharge.     Extraocular Movements: Extraocular movements intact.     Pupils: Pupils are equal, round, and reactive to light.  Cardiovascular:     Rate and Rhythm: Normal rate and regular rhythm.     Pulses: Normal pulses.     Heart sounds: Normal heart sounds. No murmur heard.   No friction rub. No gallop.  Pulmonary:     Effort: Pulmonary effort is normal. No respiratory distress.     Breath sounds: Normal breath sounds. No stridor. No wheezing, rhonchi or rales.  Skin:    General: Skin is warm and dry.     Findings: No rash.  Neurological:     Mental Status: She is alert and oriented to person, place, and time.  Psychiatric:        Mood and Affect: Mood normal.        Behavior: Behavior normal.        Thought Content: Thought content normal.        Judgment: Judgment normal.    Assessment and  Plan :   PDMP not reviewed this encounter.  1. Allergic rhinitis due to other allergic trigger, unspecified seasonality   2. Acute cough   3. Chest congestion   4. Smoker     X-ray over-read was pending at time of discharge, recommended follow up with only abnormal results. Otherwise will not call for negative over-read. Patient was in agreement. Will do an oral prednisone course given her smoking, allergies. Will use antibiotics based off of her results from the over-read. Given timeline deferred COVID 19 testing. Counseled patient on potential for adverse effects with medications prescribed/recommended today, ER and return-to-clinic precautions discussed, patient verbalized understanding.    Jaynee Eagles, PA-C 06/27/21 1446

## 2021-06-27 NOTE — ED Triage Notes (Signed)
Pt c/o cough that is congested and clear phlegm and congestion for a while.

## 2021-07-22 ENCOUNTER — Ambulatory Visit: Payer: No Typology Code available for payment source | Admitting: Nurse Practitioner

## 2021-07-22 ENCOUNTER — Other Ambulatory Visit: Payer: Self-pay

## 2021-07-22 ENCOUNTER — Encounter: Payer: Self-pay | Admitting: Nurse Practitioner

## 2021-07-22 VITALS — BP 132/64 | HR 84 | Temp 97.6°F | Resp 12 | Ht 64.0 in | Wt 162.2 lb

## 2021-07-22 DIAGNOSIS — E7800 Pure hypercholesterolemia, unspecified: Secondary | ICD-10-CM

## 2021-07-22 DIAGNOSIS — E8940 Asymptomatic postprocedural ovarian failure: Secondary | ICD-10-CM | POA: Diagnosis not present

## 2021-07-22 DIAGNOSIS — Z1231 Encounter for screening mammogram for malignant neoplasm of breast: Secondary | ICD-10-CM

## 2021-07-22 DIAGNOSIS — R3121 Asymptomatic microscopic hematuria: Secondary | ICD-10-CM

## 2021-07-22 DIAGNOSIS — Z122 Encounter for screening for malignant neoplasm of respiratory organs: Secondary | ICD-10-CM | POA: Diagnosis not present

## 2021-07-22 DIAGNOSIS — Z0000 Encounter for general adult medical examination without abnormal findings: Secondary | ICD-10-CM | POA: Insufficient documentation

## 2021-07-22 DIAGNOSIS — Z90710 Acquired absence of both cervix and uterus: Secondary | ICD-10-CM

## 2021-07-22 DIAGNOSIS — Z720 Tobacco use: Secondary | ICD-10-CM | POA: Diagnosis not present

## 2021-07-22 LAB — COMPREHENSIVE METABOLIC PANEL
ALT: 17 U/L (ref 0–35)
AST: 18 U/L (ref 0–37)
Albumin: 4.3 g/dL (ref 3.5–5.2)
Alkaline Phosphatase: 116 U/L (ref 39–117)
BUN: 10 mg/dL (ref 6–23)
CO2: 25 mEq/L (ref 19–32)
Calcium: 9.9 mg/dL (ref 8.4–10.5)
Chloride: 105 mEq/L (ref 96–112)
Creatinine, Ser: 0.59 mg/dL (ref 0.40–1.20)
GFR: 99.32 mL/min (ref >60.00)
Glucose, Bld: 83 mg/dL (ref 70–99)
Potassium: 4.3 mEq/L (ref 3.5–5.1)
Sodium: 140 mEq/L (ref 135–145)
Total Bilirubin: 0.7 mg/dL (ref 0.2–1.2)
Total Protein: 7.4 g/dL (ref 6.0–8.3)

## 2021-07-22 LAB — POCT URINALYSIS DIP (CLINITEK)
Bilirubin, UA: NEGATIVE
Glucose, UA: NEGATIVE mg/dL
Ketones, POC UA: NEGATIVE mg/dL
Leukocytes, UA: NEGATIVE
Nitrite, UA: NEGATIVE
Spec Grav, UA: >=1.03 — AB (ref 1.010–1.025)
Urobilinogen, UA: 0.2 E.U./dL
pH, UA: 5.5 (ref 5.0–8.0)

## 2021-07-22 LAB — HEMOGLOBIN A1C: Hgb A1c MFr Bld: 6.3 % (ref 4.6–6.5)

## 2021-07-22 LAB — TSH: TSH: 1.05 u[IU]/mL (ref 0.35–5.50)

## 2021-07-22 LAB — LIPID PANEL
Cholesterol: 202 mg/dL — ABNORMAL HIGH (ref 0–200)
HDL: 43.9 mg/dL (ref >39.00)
LDL Cholesterol: 132 mg/dL — ABNORMAL HIGH (ref 0–99)
NonHDL: 157.7
Total CHOL/HDL Ratio: 5
Triglycerides: 127 mg/dL (ref 0.0–149.0)
VLDL: 25.4 mg/dL (ref 0.0–40.0)

## 2021-07-22 LAB — CBC
HCT: 44.4 % (ref 36.0–46.0)
Hemoglobin: 14.4 g/dL (ref 12.0–15.0)
MCHC: 32.4 g/dL (ref 30.0–36.0)
MCV: 89.6 fl (ref 78.0–100.0)
Platelets: 611 10*3/uL — ABNORMAL HIGH (ref 150.0–400.0)
RBC: 4.96 Mil/uL (ref 3.87–5.11)
RDW: 13.7 % (ref 11.5–15.5)
WBC: 9.7 10*3/uL (ref 4.0–10.5)

## 2021-07-22 LAB — URINALYSIS, MICROSCOPIC ONLY

## 2021-07-22 NOTE — Assessment & Plan Note (Signed)
Patient is a current everyday smoker did have red blood cells positive and urinalysis dipstick.  We will send off microscopy for confirmation.  Pending result

## 2021-07-22 NOTE — Assessment & Plan Note (Addendum)
Mammogram and DEXA scan ordered today.

## 2021-07-22 NOTE — Assessment & Plan Note (Signed)
Cervical diagnosis and currently maintained on atorvastatin 20 mg.  Pending labs today continue atorvastatin 20 mg

## 2021-07-22 NOTE — Assessment & Plan Note (Signed)
Discussed lung cancer screening patient is unsure if she wants to pursue told her I would send ordering referral over when she can make that decision encouraged that she do this screening exam to prevent further health complications in the future

## 2021-07-22 NOTE — Progress Notes (Signed)
New Patient Office Visit  Subjective:  Patient ID: Tamara Espinoza, female    DOB: 13-Nov-1960  Age: 60 y.o. MRN: [id number]  CC:  Chief Complaint  Patient presents with   Trasnfer of Care    HPI Per[place] A Stoltzfus presents for complete physical and follow up of chronic conditions.  Immunizations: -Tetanus: UTd -Influenza: UTD -Covid-19: pfizer -Shingles:  infor given  -Pneumonia: 2014  -HPV:  Diet: Fair diet. Eats 3 times a day. Does skip breakfest and will have coffee. Cream no added sugar Exercise: No regular exercise. Walks twice a week for 30 minutes  Eye exam: Completes annually. Appt this month  Dental exam: Completes semi-annually.  Pap Smear: Completed in hysterectomy Mammogram: Completed in 2021 Dexa:  Need Colonoscopy: Completed in 2016/2017   Lung Cancer Screening: ordered today  Past Medical History:  Diagnosis Date   Anemia    Chicken pox    Chronic cough 06/17/2015   Eczema    Mood changes    Thrombocytosis 04/05/2014    Past Surgical History:  Procedure Laterality Date   ABDOMINAL HYSTERECTOMY     BREAST BIOPSY Right 06/27/2010   BREAST BIOPSY Left 02/06/2005   BREAST BIOPSY Bilateral 01/20/2001   BREAST EXCISIONAL BIOPSY Right 2011   BREAST EXCISIONAL BIOPSY  2006   CESAREAN SECTION     DILATION AND CURETTAGE OF UTERUS      Family History  Problem Relation Age of Onset   Cancer Mother 22       colon   Hypertension Mother    Colon cancer Mother    Other Father        suicide   Heart disease Father        ?   Hypertension Sister    Cancer Paternal Grandmother        lung ca   Stroke Neg Hx    Diabetes Neg Hx    Stomach cancer Neg Hx     Social History   Socioeconomic History   Marital status: Married    Spouse name: Not on file   Number of children: Not on file   Years of education: Not on file   Highest education level: Not on file  Occupational History   Not on file  Tobacco Use   Smoking status: Every Day     Packs/day: 0.50    Years: 35.00    Pack years: 17.50    Types: Cigarettes   Smokeless tobacco: Never  Substance and Sexual Activity   Alcohol use: Yes    Alcohol/week: 4.0 standard drinks    Types: 4 Standard drinks or equivalent per week    Comment: occasional   Drug use: Yes    Types: Marijuana    Comment: 2 puffs per day   Sexual activity: Yes  Other Topics Concern   Not on file  Social History Narrative   Married, 2 children, unemployed currently, living in [place], but coming here for care, exercise calisthenics, walks in warm weather   Social Determinants of Health   Financial Resource Strain: Not on file  Food Insecurity: Not on file  Transportation Needs: Not on file  Physical Activity: Not on file  Stress: Not on file  Social Connections: Not on file  Intimate Partner Violence: Not on file    ROS Review of Systems  Constitutional:  Positive for fatigue. Negative for chills and fever.  Respiratory:  Negative for cough and shortness of breath.   Cardiovascular:  Negative for chest  pain, palpitations and leg swelling.  Gastrointestinal:  Negative for abdominal pain, blood in stool, constipation, diarrhea, nausea and vomiting.  Genitourinary:  Negative for dysuria, hematuria, vaginal bleeding, vaginal discharge and vaginal pain.       Nocturia x2   Neurological:  Negative for dizziness, weakness, light-headedness, numbness and headaches.  Psychiatric/Behavioral:  Negative for hallucinations and suicidal ideas.    Objective:   Today's Vitals: BP 132/64   Pulse 84   Temp 97.6 F (36.4 C)   Resp 12   Ht 5\' 4"  (1.626 m)   Wt 162 lb 4 oz (73.6 kg)   SpO2 97%   BMI 27.85 kg/m   Physical Exam Vitals and nursing note reviewed.  Constitutional:      Appearance: Normal appearance.  HENT:     Right Ear: Tympanic membrane, ear canal and external ear normal. There is no impacted cerumen.     Left Ear: Tympanic membrane, ear canal and external ear normal. There is  no impacted cerumen.     Mouth/Throat:     Mouth: Mucous membranes are moist.     Pharynx: Oropharynx is clear.  Eyes:     Extraocular Movements: Extraocular movements intact.     Pupils: Pupils are equal, round, and reactive to light.     Comments: Wears corrective lenses  Cardiovascular:     Rate and Rhythm: Normal rate and regular rhythm.     Pulses: Normal pulses.  Pulmonary:     Effort: Pulmonary effort is normal.     Breath sounds: Normal breath sounds.  Abdominal:     General: Bowel sounds are normal.  Musculoskeletal:     Right lower leg: No edema.     Left lower leg: No edema.  Skin:    General: Skin is warm.  Neurological:     Mental Status: She is alert.     Motor: No weakness.     Gait: Gait normal.     Deep Tendon Reflexes: Reflexes normal.  Psychiatric:        Mood and Affect: Mood normal.        Behavior: Behavior normal.        Thought Content: Thought content normal.        Judgment: Judgment normal.    Assessment & Plan:   Problem List Items Addressed This Visit       Genitourinary   Asymptomatic microscopic hematuria    Patient is a current everyday smoker did have red blood cells positive and urinalysis dipstick.  We will send off microscopy for confirmation.  Pending result      Relevant Orders   Urinalysis, microscopic only     Other   Tobacco abuse    Discussed with patient she is does not seem ready to quit currently did discuss different options she has tried Chantix and patches in the past without great relief we did discuss briefly about Wellbutrin being her next option.  States she has tried to quit in the past without success.  Continue conversations about stopping smoking.      HLD (hyperlipidemia)    Cervical diagnosis and currently maintained on atorvastatin 20 mg.  Pending labs today continue atorvastatin 20 mg      Preventative health care - Primary   Relevant Orders   CBC   Comprehensive metabolic panel   Hemoglobin A1c    POCT URINALYSIS DIP (CLINITEK) (Completed)   Lipid panel   TSH   Screening for lung cancer  Discussed lung cancer screening patient is unsure if she wants to pursue told her I would send ordering referral over when she can make that decision encouraged that she do this screening exam to prevent further health complications in the future      Relevant Orders   Ambulatory Referral Lung Cancer Screening [facility]   Post hysterectomy menopause    Patient had a total hysterectomy and smoker we will go ahead and get a DEXA scan ordered as this puts her at greater risk for osteoporosis.      Relevant Orders   DG Bone Density   Encounter for screening mammogram for malignant neoplasm of breast    Mammogram and DEXA scan ordered today.      Relevant Orders   MM Digital Screening    Outpatient Encounter Medications as of 07/22/2021  Medication Sig   aspirin 81 MG tablet Take 81 mg by mouth daily.   atorvastatin (LIPITOR) 20 MG tablet TAKE 1 TABLET BY MOUTH EVERY DAY   Multiple Vitamins-Minerals (MULTIVITAMIN WITH MINERALS) tablet Take 1 tablet by mouth daily.   triamcinolone cream (KENALOG) 0.1 % APPLY TOPICALLY 2 (TWO) TIMES DAILY.   [DISCONTINUED] ciclopirox (LOPROX) 0.77 % cream Apply topically 2 (two) times daily.   [DISCONTINUED] predniSONE (DELTASONE) 20 MG tablet Take 2 tablets daily with breakfast.   No facility-administered encounter medications on file as of 07/22/2021.    Follow-up: Return in about 1 year (around 07/22/2022) for CPE and labs.   This visit occurred during the SARS-CoV-2 public health emergency.  Safety protocols were in place, including screening questions prior to the visit, additional usage of staff PPE, and extensive cleaning of exam room while observing appropriate contact time as indicated for disinfecting solutions.   Romilda Garret, NP

## 2021-07-22 NOTE — Patient Instructions (Signed)
Nice to see you today Think about the lung cancer screening Will be in touch regarding your labs See you in a year, sooner if needed

## 2021-07-22 NOTE — Assessment & Plan Note (Signed)
Discussed with patient she is does not seem ready to quit currently did discuss different options she has tried Chantix and patches in the past without great relief we did discuss briefly about Wellbutrin being her next option.  States she has tried to quit in the past without success.  Continue conversations about stopping smoking.

## 2021-07-22 NOTE — Assessment & Plan Note (Signed)
Patient had a total hysterectomy and smoker we will go ahead and get a DEXA scan ordered as this puts her at greater risk for osteoporosis.

## 2021-09-04 ENCOUNTER — Ambulatory Visit
Admission: RE | Admit: 2021-09-04 | Discharge: 2021-09-04 | Disposition: A | Discharge status: Home / Self Care | Payer: No Typology Code available for payment source | Source: Ambulatory Visit | Attending: Nurse Practitioner | Admitting: Nurse Practitioner

## 2021-09-04 DIAGNOSIS — Z1231 Encounter for screening mammogram for malignant neoplasm of breast: Secondary | ICD-10-CM

## 2021-11-12 ENCOUNTER — Other Ambulatory Visit: Payer: Self-pay | Admitting: Nurse Practitioner

## 2021-11-12 DIAGNOSIS — E7800 Pure hypercholesterolemia, unspecified: Secondary | ICD-10-CM

## 2021-11-20 ENCOUNTER — Other Ambulatory Visit: Payer: No Typology Code available for payment source

## 2021-12-16 ENCOUNTER — Telehealth: Payer: Self-pay

## 2021-12-16 NOTE — Telephone Encounter (Signed)
Michela Pitcher, NP; Kris Mouton, CMA ?Left message to call back to follow up on missed lab appt and reschedule it  ? ?Left message with patient's husband and on her cell phone voicemail. ?

## 2021-12-17 NOTE — Telephone Encounter (Signed)
FYI I called patient and her husband to reach patient to discuss rescheduling lab visit but have not had success yet ?

## 2022-01-08 ENCOUNTER — Ambulatory Visit
Admission: RE | Admit: 2022-01-08 | Discharge: 2022-01-08 | Disposition: A | Discharge status: Home / Self Care | Payer: No Typology Code available for payment source | Source: Ambulatory Visit | Attending: Nurse Practitioner | Admitting: Nurse Practitioner

## 2022-01-08 DIAGNOSIS — E8940 Asymptomatic postprocedural ovarian failure: Secondary | ICD-10-CM

## 2022-04-09 ENCOUNTER — Telehealth: Payer: Self-pay | Admitting: Nurse Practitioner

## 2022-04-09 ENCOUNTER — Other Ambulatory Visit: Payer: Self-pay | Admitting: Nurse Practitioner

## 2022-04-09 DIAGNOSIS — E7800 Pure hypercholesterolemia, unspecified: Secondary | ICD-10-CM

## 2022-04-09 NOTE — Telephone Encounter (Signed)
Patient called back asking for an update, advised to reschedule the appointment. We rescheduled for tomorrow at 8.

## 2022-04-09 NOTE — Telephone Encounter (Signed)
FYI patient made lab appt for tomorrow 04/10/22 for lipid re check.

## 2022-04-09 NOTE — Telephone Encounter (Signed)
Sending to PCP. Also I called and left message to have patient reschedule missed lab appointment in march  I have had hard time reaching patient over the last few months (see phone notes, result notes) I have called all the numbers in the chart for patient and her husband and mailed letter in the past with no communication back. Just fyi

## 2022-04-09 NOTE — Telephone Encounter (Signed)
Caller Name: Ting Lorge Call back phone #: [phone]  MEDICATION(S): atorvastatin (LIPITOR) 20 MG tablet  Days of Med Remaining: no  Has the patient contacted their pharmacy (YES/NO)? Yes, advised to call us What did pharmacy advise?  Preferred [facility]: [facility] on [address]  ~~~Please advise patient/caregiver to allow 2-3 business days to process RX refills.

## 2022-04-10 ENCOUNTER — Other Ambulatory Visit (INDEPENDENT_AMBULATORY_CARE_PROVIDER_SITE_OTHER): Payer: No Typology Code available for payment source

## 2022-04-10 DIAGNOSIS — E7800 Pure hypercholesterolemia, unspecified: Secondary | ICD-10-CM

## 2022-04-10 LAB — LIPID PANEL
Cholesterol: 201 mg/dL — ABNORMAL HIGH (ref 0–200)
HDL: 44.9 mg/dL (ref >39.00)
LDL Cholesterol: 137 mg/dL — ABNORMAL HIGH (ref 0–99)
NonHDL: 156.05
Total CHOL/HDL Ratio: 4
Triglycerides: 94 mg/dL (ref 0.0–149.0)
VLDL: 18.8 mg/dL (ref 0.0–40.0)

## 2022-04-10 LAB — COMPREHENSIVE METABOLIC PANEL
ALT: 14 U/L (ref 0–35)
AST: 18 U/L (ref 0–37)
Albumin: 4 g/dL (ref 3.5–5.2)
Alkaline Phosphatase: 100 U/L (ref 39–117)
BUN: 10 mg/dL (ref 6–23)
CO2: 26 mEq/L (ref 19–32)
Calcium: 9.5 mg/dL (ref 8.4–10.5)
Chloride: 107 mEq/L (ref 96–112)
Creatinine, Ser: 0.62 mg/dL (ref 0.40–1.20)
GFR: 97.65 mL/min (ref >60.00)
Glucose, Bld: 90 mg/dL (ref 70–99)
Potassium: 3.9 mEq/L (ref 3.5–5.1)
Sodium: 142 mEq/L (ref 135–145)
Total Bilirubin: 0.5 mg/dL (ref 0.2–1.2)
Total Protein: 7.1 g/dL (ref 6.0–8.3)

## 2022-04-14 ENCOUNTER — Telehealth: Payer: Self-pay | Admitting: Nurse Practitioner

## 2022-04-14 DIAGNOSIS — E7800 Pure hypercholesterolemia, unspecified: Secondary | ICD-10-CM

## 2022-04-14 MED ORDER — ATORVASTATIN CALCIUM 20 MG PO TABS: 20.0000 mg | ORAL_TABLET | Freq: Every day | ORAL | 1 refills | Status: DC

## 2022-04-14 NOTE — Telephone Encounter (Signed)
Left message to call back Needs 3 months fasting lab appointment.

## 2022-04-14 NOTE — Telephone Encounter (Signed)
Lab appointment made.

## 2022-04-14 NOTE — Telephone Encounter (Signed)
I will send in the medication and she will need a three month lab follow up. Medication sent to pharmacy and labs placed in future

## 2022-04-14 NOTE — Telephone Encounter (Signed)
Patient called back about message below:   Mardelle Matte, [facility]  04/13/2022  3:17 PM EDT Back to Top    Called left message w/ her husband  for patient to call us back regarding her lab results. And medication.pt husband said that she was concerned about getting her medication , he said that she was not allowed  to have a her phone a work . She will have call us back on her lunch break.   Michela Pitcher, NP  04/13/2022  7:38 AM EDT     I have that she was on atorvastatin '20mg'$  at our last office visit   Her cholesterol has went up some. Her LDL is 137 and we want it to be below 100. Her triglycerides did come down some which is good . Can we verify if she is or isnt taking the atorvastatin. If she is not I think we should.   Liver and kidneys look good      The 10-year ASCVD risk score (Arnett DK, et al., 2019) is: 11.5%   Values used to calculate the score:     Age: 61 years     Sex: Female     Is Non-Hispanic African American: Yes     Diabetic: No     Tobacco smoker: Yes     Systolic Blood Pressure: 237 mmHg     Is BP treated: No     HDL Cholesterol: 44.9 mg/dL     Total Cholesterol: 201 mg/dL    I made her aware of message and she said that she hasnt been taking atorvastatin because she doesn't have anymore and hasnt for a while. She asked if it could be sent into [facility] on [address]. Her phone number is [phone]

## 2022-05-05 ENCOUNTER — Ambulatory Visit: Payer: No Typology Code available for payment source | Admitting: Nurse Practitioner

## 2022-05-05 VITALS — BP 114/70 | HR 80 | Temp 97.2°F | Resp 16 | Ht 64.0 in | Wt 142.0 lb

## 2022-05-05 DIAGNOSIS — L2082 Flexural eczema: Secondary | ICD-10-CM

## 2022-05-05 DIAGNOSIS — M21372 Foot drop, left foot: Secondary | ICD-10-CM

## 2022-05-05 MED ORDER — TRIAMCINOLONE ACETONIDE 0.1 % EX CREA: TOPICAL_CREAM | Freq: Two times a day (BID) | CUTANEOUS | 2 refills | Status: DC

## 2022-05-05 NOTE — Patient Instructions (Signed)
Nice to see you today I need you to call murphy/wainer today and see about being seen I want to see you in approx 3 months for you physical, sooner if you need me

## 2022-05-05 NOTE — Progress Notes (Signed)
Established Patient Office Visit  Subjective   Patient ID: Tamara Espinoza, female    DOB: 03-Feb-1961  Age: 61 y.o. MRN: [id number]  Chief Complaint  Patient presents with   Leg Injury    Left leg injury 8 days ago, slipped and fell in her garage on the wet floor and her left leg went behind her and then she fast straighten it back out and pulled her leg. A week after that started to have left foot drop. Saw provider at [facility] ortho urgent care on 05/03/22 and working on getting set up with neuro nerve conduction study and follow up. Patient would like to get back to work and needs a note. She does need a RX for AFO boot for [facility].    HPI  Leg injury: States that she was going into the garage and slipped and fell. States that the leg went behind her. States that she went to work and noticed that she was walking heavy :like a horse.  Patient was evaluated at [facility] urgent care on Sunday, 05/03/2022.  She is supposed to follow-up with [facility] within the week.  They did recommend she get a AFO brace but did not give her a DME order.  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     Review of Systems  Constitutional:  Negative for chills and fever.  Neurological:  Positive for focal weakness. Negative for weakness.      Objective:     BP 114/70   Pulse 80   Temp (!) 97.2 F (36.2 C)   Resp 16   Ht '5\' 4"'$  (1.626 m)   Wt 142 lb (64.4 kg)   SpO2 97%   BMI 24.37 kg/m      Physical Exam Vitals and nursing note reviewed.  Constitutional:      Appearance: Normal appearance.  Cardiovascular:     Rate and Rhythm: Normal rate and regular rhythm.     Pulses:          Dorsalis pedis pulses are 2+ on the left side.       Posterior tibial pulses are 2+ on the left side.     Heart sounds: Normal heart sounds.  Pulmonary:     Effort: Pulmonary effort is normal.      Breath sounds: Normal breath sounds.  Musculoskeletal:        General: Signs of injury present.     Right lower leg: No edema.     Left lower leg: No edema.  Neurological:     Mental Status: She is alert.     Deep Tendon Reflexes:     Reflex Scores:      Patellar reflexes are 2+ on the right side and 2+ on the left side.    Comments: Left sided foot drop  Bilateral lower extremity strength 5/5      No results found for any visits on 05/05/22.    The 10-year ASCVD risk score (Arnett DK, et al., 2019) is: 7.6%    Assessment & Plan:   Problem List Items Addressed This Visit       Musculoskeletal and Integument   Eczema    Patient needing refill on her steroid cream for eczema.  Refill provided      Relevant Medications   triamcinolone cream (KENALOG) 0.1 %     Other   Left foot  drop - Primary    Was seen at [place].  Left foot drop they recommend AFO brace cannot give DME order.  DME order provided today.  Patient needs a work note to return to work.  Work note written for her to return to work starting tomorrow with no restrictions.  Discussed use of AFO brace and following up with [facility] as recommended from [facility].  Also got medical record release to get records      Relevant Orders   Brace    Return in about 3 months (around 08/05/2022) for for CPE .    Romilda Garret, NP

## 2022-05-05 NOTE — Assessment & Plan Note (Signed)
Was seen at [facility] orthopedics.  Left foot drop they recommend AFO brace cannot give DME order.  DME order provided today.  Patient needs a work note to return to work.  Work note written for her to return to work starting tomorrow with no restrictions.  Discussed use of AFO brace and following up with [facility] as recommended from [place].  Also got medical record release to get records

## 2022-05-05 NOTE — Assessment & Plan Note (Signed)
Patient needing refill on her steroid cream for eczema.  Refill provided

## 2022-06-12 ENCOUNTER — Encounter: Payer: Self-pay | Admitting: Neurology

## 2022-07-17 ENCOUNTER — Other Ambulatory Visit (INDEPENDENT_AMBULATORY_CARE_PROVIDER_SITE_OTHER): Payer: No Typology Code available for payment source

## 2022-07-17 DIAGNOSIS — E7800 Pure hypercholesterolemia, unspecified: Secondary | ICD-10-CM

## 2022-07-17 LAB — COMPREHENSIVE METABOLIC PANEL
ALT: 14 U/L (ref 0–35)
AST: 19 U/L (ref 0–37)
Albumin: 4.4 g/dL (ref 3.5–5.2)
Alkaline Phosphatase: 101 U/L (ref 39–117)
BUN: 12 mg/dL (ref 6–23)
CO2: 28 mEq/L (ref 19–32)
Calcium: 9.7 mg/dL (ref 8.4–10.5)
Chloride: 104 mEq/L (ref 96–112)
Creatinine, Ser: 0.55 mg/dL (ref 0.40–1.20)
GFR: 100.32 mL/min (ref >60.00)
Glucose, Bld: 76 mg/dL (ref 70–99)
Potassium: 3.9 mEq/L (ref 3.5–5.1)
Sodium: 140 mEq/L (ref 135–145)
Total Bilirubin: 0.6 mg/dL (ref 0.2–1.2)
Total Protein: 7.6 g/dL (ref 6.0–8.3)

## 2022-07-17 LAB — LIPID PANEL
Cholesterol: 193 mg/dL (ref 0–200)
HDL: 51.7 mg/dL
LDL Cholesterol: 124 mg/dL — ABNORMAL HIGH (ref 0–99)
NonHDL: 140.97
Total CHOL/HDL Ratio: 4
Triglycerides: 86 mg/dL (ref 0.0–149.0)
VLDL: 17.2 mg/dL (ref 0.0–40.0)

## 2022-07-20 ENCOUNTER — Encounter: Payer: Self-pay | Admitting: Neurology

## 2022-07-20 ENCOUNTER — Ambulatory Visit (INDEPENDENT_AMBULATORY_CARE_PROVIDER_SITE_OTHER): Payer: No Typology Code available for payment source | Admitting: Neurology

## 2022-07-20 VITALS — BP 111/60 | HR 80 | Ht 64.0 in | Wt 146.0 lb

## 2022-07-20 DIAGNOSIS — M21372 Foot drop, left foot: Secondary | ICD-10-CM

## 2022-07-20 NOTE — Progress Notes (Signed)
[facility] Clinic Note - Initial Visit   Date: 07/20/2022   Tamara Espinoza MRN: [id number] DOB: Jan 10, 1961   Dear Karl Ito, NP:  Thank you for your kind referral of Tamara Espinoza for consultation of left foot drop. Although her history is well known to you, please allow [place] to reiterate it for the purpose of our medical record. The patient was accompanied to the clinic by husband who also provides collateral information.     Tamara Espinoza is a 61 y.o. right-handed female with hyperlipidemia presenting for evaluation of left foot drop.   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  It is reassuring that she has appreciated some improvement over the past few months.  Patient informed that neurological recovery is slow and may take months.  - NCS/EMG of the left leg  - Avoid crossing the legs  - Start PT for left foot drop strengthening  Return to clinic in 3 months  ------------------------------------------------------------- History of present illness: She slipped in her garage in August while trying to get inside from the rain and landed with her left leg hyperflexed and to the side.  She does not recall having any severe pain, but reports having discomfort of the leg.  A week later, she developed foot weakness, such that she was unable to raise the foot and felt like she was walking "like a horse".  She was seen by her PCP who referred her to see Dr. French Ana, orthopeadics.  During this evaluation, he was concerned about her foot drop and noticed trace, if any movement of dorsiflexion and toe extension on the left.  She was referred to neurology for further evaluation.  Over the past 2 months, she is able to move her foot and toes some.  She has some numbness/tingling over the dorsum of her left foot.  She has not had any falls and is compliant with using a left AFO.  No  radicular or back pain.   Out-side paper records, electronic medical record, and images have been reviewed where available and summarized as:  Lab Results  Component Value Date   HGBA1C 6.3 07/22/2021   Lab Results  Component Value Date   JOINOMVE72 094 02/12/2014   Lab Results  Component Value Date   TSH 1.05 07/22/2021   Lab Results  Component Value Date   ESRSEDRATE 15 04/05/2014    Past Medical History:  Diagnosis Date   Anemia    Chicken pox    Chronic cough 06/17/2015   Eczema    Mood changes    Thrombocytosis 04/05/2014    Past Surgical History:  Procedure Laterality Date   ABDOMINAL HYSTERECTOMY     BREAST BIOPSY Right 06/27/2010   BREAST BIOPSY Left 02/06/2005   BREAST BIOPSY Bilateral 01/20/2001   BREAST EXCISIONAL BIOPSY Right 2011   BREAST EXCISIONAL BIOPSY  2006   CESAREAN SECTION     DILATION AND CURETTAGE OF UTERUS       Medications:  Outpatient Encounter Medications as of 07/20/2022  Medication Sig   aspirin 81 MG tablet Take 81 mg by mouth daily.   atorvastatin (LIPITOR) 20 MG tablet Take 1 tablet (20 mg total) by mouth daily.   Multiple Vitamins-Minerals (MULTIVITAMIN WITH MINERALS) tablet Take 1 tablet by mouth daily.   triamcinolone cream (KENALOG) 0.1 % Apply topically 2 (two) times daily. APPLY TOPICALLY 2 (TWO) TIMES DAILY.   No facility-administered encounter medications  on file as of 07/20/2022.    Allergies: No Known Allergies  Family History: Family History  Problem Relation Age of Onset   Cancer Mother 62       colon   Hypertension Mother    Colon cancer Mother    Other Father        suicide   Heart disease Father        ?   Hypertension Sister    Cancer Paternal Grandmother        lung ca   Stroke Neg Hx    Diabetes Neg Hx    Stomach cancer Neg Hx     Social History: Social History   Tobacco Use   Smoking status: Every Day    Packs/day: 0.50    Years: 40.00    Total pack years: 20.00    Types: Cigarettes    Smokeless tobacco: Never  Substance Use Topics   Alcohol use: Yes    Alcohol/week: 4.0 standard drinks of alcohol    Types: 4 Standard drinks or equivalent per week    Comment: Beer 2 day 12 oz   Drug use: Yes    Types: Marijuana    Comment: 2 puffs per day   Social History   Social History Narrative   Married, 2 children, unemployed currently, living in [place], but coming here for care, exercise calisthenics, walks in warm weather         Are you right handed or left handed? Right Handed   Are you currently employed ? 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 No    Who lives with you? Husband   What type of home do you live in: 1 story or 2 story? Lives in a two story home         Vital Signs:  BP 111/60   Pulse 80   Ht '5\' 4"'$  (1.626 m)   Wt 146 lb (66.2 kg)   SpO2 98%   BMI 25.06 kg/m     Neurological Exam: MENTAL STATUS including orientation to time, place, person, recent and remote memory, attention span and concentration, language, and fund of knowledge is normal.  Speech is not dysarthric.  CRANIAL NERVES: II:  No visual field defects.  III-IV-VI: Pupils equal round and reactive to light.  Normal conjugate, extra-ocular eye movements in all directions of gaze.  No nystagmus.  No ptosis.   V:  Normal facial sensation.    VII:  Normal facial symmetry and movements.   VIII:  Normal hearing and vestibular function.   IX-X:  Normal palatal movement.   XI:  Normal shoulder shrug and head rotation.   XII:  Normal tongue strength and range of motion, no deviation or fasciculation.  MOTOR:  No atrophy, fasciculations or abnormal movements.  No pronator drift.   Upper Extremity:  Right  Left  Deltoid  5/5   5/5   Biceps  5/5   5/5   Triceps  5/5   5/5   Wrist extensors  5/5   5/5   Wrist flexors  5/5   5/5   Finger extensors  5/5   5/5   Finger flexors  5/5   5/5   Dorsal interossei  5/5   5/5   Abductor pollicis  5/5   5/5    Tone (Ashworth scale)  0  0   Lower Extremity:  Right  Left  Hip flexors  5/5  5/5   Knee flexors  5/5   5/5   Knee extensors  5/5   5/5   Dorsiflexors  5/5   3/5   Plantarflexors  5/5   5/5   Inversion 5/5  5/5  Eversion 5/5  2/5  Toe extensors  5/5   3/5   Toe flexors  5/5   5/5   Tone (Ashworth scale)  0  0   MSRs:                                           Right        Left brachioradialis 2+  2+  biceps 2+  2+  triceps 2+  2+  patellar 2+  2+  ankle jerk 2+  2+  Hoffman no  no  plantar response down  down   SENSORY:  Vibration reduced at the great toe bilaterally, temperature reduced over the left lateral leg, and pin prick intact throughout.   COORDINATION/GAIT: Normal finger-to- nose-finger.  Intact rapid alternating movements bilaterally.  Able to rise from a chair without using arms.  There is mild steppage gait on the left. Toe walking intact.  She is unable to walk on heel on the left.      Thank you for allowing me to participate in patient's care.  If I can answer any additional questions, I would be pleased to do so.    Sincerely,    Sohrab Keelan K. Posey Pronto, DO

## 2022-07-20 NOTE — Patient Instructions (Addendum)
Start physical therapy for left foot drop  Avoid crossing your legs  Nerve testing of the left leg  Return to clinic in 3 months  [place] (EMG/NCS) INSTRUCTIONS  How to Prepare The neurologist conducting the EMG will need to know if you have certain medical conditions. Tell the neurologist and other EMG lab personnel if you: Have a pacemaker or any other electrical medical device Take blood-thinning medications Have hemophilia, a blood-clotting disorder that causes prolonged bleeding Bathing Take a shower or bath shortly before your exam in order to remove oils from your skin. Don't apply lotions or creams before the exam.  What to Expect You'll likely be asked to change into a hospital gown for the procedure and lie down on an examination table. The following explanations can help you understand what will happen during the exam.  Electrodes. The neurologist or a technician places surface electrodes at various locations on your skin depending on where you're experiencing symptoms. Or the neurologist may insert needle electrodes at different sites depending on your symptoms.  Sensations. The electrodes will at times transmit a tiny electrical current that you may feel as a twinge or spasm. The needle electrode may cause discomfort or pain that usually ends shortly after the needle is removed. If you are concerned about discomfort or pain, you may want to talk to the neurologist about taking a short break during the exam.  Instructions. During the needle EMG, the neurologist will assess whether there is any spontaneous electrical activity when the muscle is at rest - activity that isn't present in healthy muscle tissue - and the degree of activity when you slightly contract the muscle.  He or she will give you instructions on resting and contracting a muscle at appropriate times. Depending on what muscles and nerves the neurologist is examining, he or she may  ask you to change positions during the exam.  After your EMG You may experience some temporary, minor bruising where the needle electrode was inserted into your muscle. This bruising should fade within several days. If it persists, contact your primary care doctor.

## 2022-07-31 ENCOUNTER — Ambulatory Visit (INDEPENDENT_AMBULATORY_CARE_PROVIDER_SITE_OTHER): Payer: No Typology Code available for payment source | Admitting: Nurse Practitioner

## 2022-07-31 ENCOUNTER — Encounter: Payer: Self-pay | Admitting: Nurse Practitioner

## 2022-07-31 VITALS — BP 124/70 | HR 68 | Temp 97.0°F | Resp 12 | Ht 64.0 in | Wt 147.0 lb

## 2022-07-31 DIAGNOSIS — E663 Overweight: Secondary | ICD-10-CM | POA: Diagnosis not present

## 2022-07-31 DIAGNOSIS — Z1231 Encounter for screening mammogram for malignant neoplasm of breast: Secondary | ICD-10-CM

## 2022-07-31 DIAGNOSIS — L309 Dermatitis, unspecified: Secondary | ICD-10-CM

## 2022-07-31 DIAGNOSIS — Z122 Encounter for screening for malignant neoplasm of respiratory organs: Secondary | ICD-10-CM

## 2022-07-31 DIAGNOSIS — Z0000 Encounter for general adult medical examination without abnormal findings: Secondary | ICD-10-CM | POA: Diagnosis not present

## 2022-07-31 DIAGNOSIS — M21372 Foot drop, left foot: Secondary | ICD-10-CM

## 2022-07-31 DIAGNOSIS — Z720 Tobacco use: Secondary | ICD-10-CM

## 2022-07-31 DIAGNOSIS — R911 Solitary pulmonary nodule: Secondary | ICD-10-CM

## 2022-07-31 DIAGNOSIS — Z1211 Encounter for screening for malignant neoplasm of colon: Secondary | ICD-10-CM

## 2022-07-31 DIAGNOSIS — E785 Hyperlipidemia, unspecified: Secondary | ICD-10-CM

## 2022-07-31 LAB — CBC
HCT: 39.8 % (ref 36.0–46.0)
Hemoglobin: 13.1 g/dL (ref 12.0–15.0)
MCHC: 32.8 g/dL (ref 30.0–36.0)
MCV: 90.8 fl (ref 78.0–100.0)
Platelets: 620 10*3/uL — ABNORMAL HIGH (ref 150.0–400.0)
RBC: 4.38 Mil/uL (ref 3.87–5.11)
RDW: 13.1 % (ref 11.5–15.5)
WBC: 9.4 10*3/uL (ref 4.0–10.5)

## 2022-07-31 LAB — COMPREHENSIVE METABOLIC PANEL
ALT: 11 U/L (ref 0–35)
AST: 15 U/L (ref 0–37)
Albumin: 4.2 g/dL (ref 3.5–5.2)
Alkaline Phosphatase: 97 U/L (ref 39–117)
BUN: 13 mg/dL (ref 6–23)
CO2: 29 mEq/L (ref 19–32)
Calcium: 9.4 mg/dL (ref 8.4–10.5)
Chloride: 107 mEq/L (ref 96–112)
Creatinine, Ser: 0.61 mg/dL (ref 0.40–1.20)
GFR: 97.82 mL/min (ref >60.00)
Glucose, Bld: 85 mg/dL (ref 70–99)
Potassium: 4.1 mEq/L (ref 3.5–5.1)
Sodium: 141 mEq/L (ref 135–145)
Total Bilirubin: 0.4 mg/dL (ref 0.2–1.2)
Total Protein: 6.9 g/dL (ref 6.0–8.3)

## 2022-07-31 LAB — TSH: TSH: 0.91 u[IU]/mL (ref 0.35–5.50)

## 2022-07-31 LAB — LIPID PANEL
Cholesterol: 171 mg/dL (ref 0–200)
HDL: 51.1 mg/dL (ref >39.00)
LDL Cholesterol: 101 mg/dL — ABNORMAL HIGH (ref 0–99)
NonHDL: 120.27
Total CHOL/HDL Ratio: 3
Triglycerides: 96 mg/dL (ref 0.0–149.0)
VLDL: 19.2 mg/dL (ref 0.0–40.0)

## 2022-07-31 LAB — HEMOGLOBIN A1C: Hgb A1c MFr Bld: 5.9 % (ref 4.6–6.5)

## 2022-07-31 NOTE — Assessment & Plan Note (Signed)
Discussed age-appropriate immunizations and screening exams.  Flu shot up-to-date.  Patient needs up-to-date colonoscopy, mammogram, LDCT.  Orders placed today.  Patient was given information at discharge about preventative healthcare maintenance for her age range along with anticipatory guidance.

## 2022-07-31 NOTE — Progress Notes (Signed)
Established Patient Office Visit  Subjective   Patient ID: Tamara Espinoza, female    DOB: [birth year]  Age: 61 y.o. MRN: [id number]  Chief Complaint  Patient presents with   Annual Exam    Had hysterectomy    HPI  for complete physical and follow up of chronic conditions.  HLD: tolerates atorvastatin well.  Has lost weight since starting her job which is very physical.  Tobacco: Has tried patches in the past that made her sick.  Immunizations: -Tetanus:2019 -Influenza:0/29/2023 -Shingles: information discussed and given in office -Pneumonia: Too young  -HPV: Aged out  Diet: [place]. States will have breakfast and lunch every day somedays she will have the 3rd meal. Sometimes. Water, tea and coffee Exercise: No regular exercise outside of employment   Eye exam: Completes annually 2023. Glasses Dental exam: Needs updating 2022  Pap Smear: Patient had a hysterectomy Mammogram: Completed in 09/04/2021.  Order placed for her to get at the end of the year.  Colonoscopy: Completed in needs updating last 1 was in 2016 with 5-year recall ambulatory order placed today Lung Cancer Screening: Ambulatory placed.  Never completed another order placed this year stressed the importance of doing low-dose CT scan.  Patient does have a history of a pulmonary nodule on a CT scan of greater than 5 years ago Dexa: Completed in 09/04/2021  Sleep: States she will get up at 5am and goes to bed around 830-9. Feels rested      Review of Systems  Constitutional:  Negative for chills and fever.  Respiratory:  Negative for shortness of breath.   Cardiovascular:  Negative for chest pain.  Gastrointestinal:  Negative for abdominal pain, blood in stool, constipation, diarrhea, nausea and vomiting.       BM every other day   Genitourinary:  Negative for dysuria and hematuria.  Neurological:  Negative for tingling and headaches.  Psychiatric/Behavioral:  Negative for hallucinations and  suicidal ideas.       Objective:     BP 124/70   Pulse 68   Temp (!) 97 F (36.1 C)   Resp 12   Ht '5\' 4"'$  (1.626 m)   Wt 147 lb (66.7 kg)   SpO2 98%   BMI 25.23 kg/m  BP Readings from Last 3 Encounters:  07/31/22 124/70  07/20/22 111/60  05/05/22 114/70   Wt Readings from Last 3 Encounters:  07/31/22 147 lb (66.7 kg)  07/20/22 146 lb (66.2 kg)  05/05/22 142 lb (64.4 kg)      Physical Exam Vitals and nursing note reviewed.  Constitutional:      Appearance: Normal appearance.  HENT:     Right Ear: Tympanic membrane, ear canal and external ear normal.     Left Ear: Tympanic membrane, ear canal and external ear normal.     Mouth/Throat:     Mouth: Mucous membranes are moist.     Pharynx: Oropharynx is clear.  Eyes:     Extraocular Movements: Extraocular movements intact.     Pupils: Pupils are equal, round, and reactive to light.     Comments: Wears glasses   Cardiovascular:     Rate and Rhythm: Normal rate and regular rhythm.     Pulses: Normal pulses.     Heart sounds: Normal heart sounds.  Pulmonary:     Effort: Pulmonary effort is normal.     Breath sounds: Normal breath sounds.  Abdominal:     General: Bowel sounds are normal. There is no distension.  Palpations: There is no mass.     Tenderness: There is no abdominal tenderness.     Hernia: No hernia is present.  Musculoskeletal:     Right lower leg: No edema.     Left lower leg: No edema.  Lymphadenopathy:     Cervical: No cervical adenopathy.  Skin:    General: Skin is warm.  Neurological:     Mental Status: She is alert. Mental status is at baseline.     Deep Tendon Reflexes:     Reflex Scores:      Bicep reflexes are 2+ on the right side and 2+ on the left side.      Patellar reflexes are 2+ on the right side and 2+ on the left side.    Comments: Left foot drop, with a brace - seeing neurology   Psychiatric:        Mood and Affect: Mood normal.        Behavior: Behavior normal.         Thought Content: Thought content normal.        Judgment: Judgment normal.      No results found for any visits on 07/31/22.    The 10-year ASCVD risk score (Arnett DK, et al., 2019) is: 8.5%    Assessment & Plan:   Problem List Items Addressed This Visit       Musculoskeletal and Integument   Eczema    Patient uses Kenalog cream on a as needed basis.  Stable        Other   Tobacco abuse    Patient still everyday smoker.  Ambulatory referral for lung cancer screening placed today      Solitary lung nodule   HLD (hyperlipidemia)    Patient currently maintained on atorvastatin 20 mg.  Patient has lost weight since last physical.  Pending labs today      Relevant Orders   Lipid panel   Preventative health care - Primary    Discussed age-appropriate immunizations and screening exams.  Flu shot up-to-date.  Patient needs up-to-date colonoscopy, mammogram, LDCT.  Orders placed today.  Patient was given information at discharge about preventative healthcare maintenance for her age range along with anticipatory guidance.      Relevant Orders   CBC   Comprehensive metabolic panel   Hemoglobin A1c   TSH   Lipid panel   Screening for lung cancer   Relevant Orders   Ambulatory Referral Lung Cancer Screening [facility]   Encounter for screening mammogram for malignant neoplasm of breast   Relevant Orders   MM Digital Screening   Left foot drop    Currently being followed by neurology patient still has left lower extremity brace      Other Visit Diagnoses     Screening for colon cancer       Relevant Orders   Ambulatory referral to Gastroenterology   Overweight       Relevant Orders   Hemoglobin A1c   Lipid panel       Return in about 1 year (around 08/01/2023) for CPe and labs.    Romilda Garret, NP

## 2022-07-31 NOTE — Assessment & Plan Note (Signed)
Patient uses Kenalog cream on a as needed basis.  Stable

## 2022-07-31 NOTE — Assessment & Plan Note (Signed)
Patient still everyday smoker.  Ambulatory referral for lung cancer screening placed today

## 2022-07-31 NOTE — Patient Instructions (Signed)
Nice to see you today I will be in touch with the labs once I have the results Follow up with me in 1 year, sooner if you need me  Call and get your mammogram scheduled  I recommend that you get the Shingrix vaccine (shingles shot) you can call and get it without seeing me. I have attached information on it

## 2022-07-31 NOTE — Assessment & Plan Note (Signed)
Patient currently maintained on atorvastatin 20 mg.  Patient has lost weight since last physical.  Pending labs today

## 2022-07-31 NOTE — Assessment & Plan Note (Signed)
Currently being followed by neurology patient still has left lower extremity brace

## 2022-08-13 ENCOUNTER — Encounter: Payer: Self-pay | Admitting: Nurse Practitioner

## 2022-08-14 ENCOUNTER — Encounter: Payer: No Typology Code available for payment source | Admitting: Nurse Practitioner

## 2022-08-20 ENCOUNTER — Ambulatory Visit (INDEPENDENT_AMBULATORY_CARE_PROVIDER_SITE_OTHER): Payer: No Typology Code available for payment source | Admitting: Neurology

## 2022-08-20 DIAGNOSIS — M21372 Foot drop, left foot: Secondary | ICD-10-CM

## 2022-08-20 DIAGNOSIS — G5702 Lesion of sciatic nerve, left lower limb: Secondary | ICD-10-CM

## 2022-08-20 NOTE — Procedures (Signed)
[facility] Neu[facility]  [address]  [place], [place] [address] Tel: [phone] Fax: [phone] Test Date:  08/20/2022  Patient: Tamara Espinoza DOB: [birth year] Physician: Narda Amber, DO  Sex: Female Height: '5\' 4"'$  Ref Phys: Narda Amber, DO  ID#: [id number]   Technician:    History: This is a 61 year old female referred for evaluation of left foot drop.  NCV & EMG Findings: Extensive electrodiagnostic testing of the left lower extremity and additional studies of the right shows:  Left superficial peroneal sensory response is asymmetrically reduced as compared to the right (L4.2, R13.0 V).  Left sural sensory response is within normal limits. Left peroneal motor response at the tibialis anterior is asymmetrically reduced (L2.8, R5.6 mV).  Left peroneal motor responses within normal limits. Left tibial H reflex study is within normal limits. Chronic motor axonal loss changes are seen affecting the left anterior tibialis and fibularis longus muscles, without accompanying active denervation.  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   ___________________________ Narda Amber, DO    Nerve Conduction Studies   Stim Site NR Peak (ms) Norm Peak (ms) O-P Amp (V) Norm O-P Amp  Left Sup Peroneal Anti Sensory (Ant Lat Mall)  33 C  12 cm    2.1 <4.6 4.2 >4  Right Sup Peroneal Anti Sensory (Ant Lat Mall)  33 C  12 cm    2.1 <4.6 13.0 >4  Left Sural Anti Sensory (Lat Mall)  33 C  Calf    2.4 <4.6 15.8 >4     Stim Site NR Onset (ms) Norm Onset (ms) O-P Amp (mV) Norm O-P Amp Site1 Site2 Delta-0 (ms) Dist (cm) Vel (m/s) Norm Vel (m/s)  Left Peroneal Motor (Ext Dig Brev)  33 C  Ankle    3.4 <6.0 3.4 >2.5 B Fib Ankle 7.3 34.0 47 >40  B Fib    10.7  2.6  Poplt B Fib 2.0 10.0 50 >40  Poplt    12.7  2.3         Left Peroneal TA Motor  (Tib Ant)  33 C  Fib Head    3.8 <4.5 *2.8 >3 Poplit Fib Head 1.4 7.0 50 >40  Poplit    5.2 <5.7 2.7         Right Peroneal TA Motor (Tib Ant)  33 C  Fib Head    2.3 <4.5 5.6 >3 Poplit Fib Head 1.2 7.0 58 >40  Poplit    3.5 <5.7 5.6         Left Tibial Motor (Abd Hall Brev)  33 C  Ankle    5.0 <6.0 9.6 >4 Knee Ankle 7.6 38.0 50 >40  Knee    12.6  5.5          Electromyography   Side Muscle Ins.Act Fibs Fasc Recrt Amp Dur Poly Activation Comment  Left AntTibialis Nml Nml Nml *2- *1+ *1+ *1+ Nml N/A  Left Gastroc Nml Nml Nml Nml Nml Nml Nml Nml N/A  Left Flex Dig Long Nml Nml Nml Nml Nml Nml Nml Nml N/A  Left BicepsFemS Nml Nml Nml Nml Nml Nml Nml Nml N/A  Left RectFemoris Nml Nml Nml Nml Nml Nml Nml Nml N/A  Left GluteusMed Nml Nml Nml Nml Nml Nml Nml Nml N/A  Left Fibularis Long Nml Nml Nml *3- *1+ *1+ *1+ *Variable N/A      Waveforms:

## 2022-10-06 ENCOUNTER — Other Ambulatory Visit: Payer: Self-pay | Admitting: Nurse Practitioner

## 2022-10-06 DIAGNOSIS — E7800 Pure hypercholesterolemia, unspecified: Secondary | ICD-10-CM

## 2022-11-20 ENCOUNTER — Ambulatory Visit: Payer: No Typology Code available for payment source

## 2022-11-30 ENCOUNTER — Ambulatory Visit: Payer: No Typology Code available for payment source | Admitting: Neurology

## 2022-12-18 ENCOUNTER — Encounter: Payer: Self-pay | Admitting: Neurology

## 2022-12-18 ENCOUNTER — Ambulatory Visit (INDEPENDENT_AMBULATORY_CARE_PROVIDER_SITE_OTHER): Payer: No Typology Code available for payment source | Admitting: Neurology

## 2022-12-18 VITALS — BP 129/73 | HR 68 | Ht 67.0 in | Wt 151.0 lb

## 2022-12-18 DIAGNOSIS — G5702 Lesion of sciatic nerve, left lower limb: Secondary | ICD-10-CM | POA: Diagnosis not present

## 2022-12-18 NOTE — Patient Instructions (Signed)
It was great to see you today.

## 2022-12-18 NOTE — Progress Notes (Signed)
Follow-up Visit   Date: 12/18/2022    Tamara Espinoza MRN: [id number] DOB: November 26, 1960    Tamara Espinoza is a 62 y.o. 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  The patient was accompanied to the clinic by husband who also provides collateral information.    IMPRESSION/PLAN: Left peroneal mononeuropathy (nonlocalizable) following fall.  Resolved.  Exam shows normal distal strength and sensation.  Continue home exercises.  Return to clinic as needed  --------------------------------------------- History of present illness: She slipped in her garage in August while trying to get inside from the rain and landed with her left leg hyperflexed and to the side.  She does not recall having any severe pain, but reports having discomfort of the leg.  A week later, she developed foot weakness, such that she was unable to raise the foot and felt like she was walking "like a horse".  She was seen by her PCP who referred her to see Dr. Madelon Lipsaffrey, orthopeadics.  During this evaluation, he was concerned about her foot drop and noticed trace, if any movement of dorsiflexion and toe extension on the left.  She was referred to neurology for further evaluation.  Over the past 2 months, she is able to move her foot and toes some.  She has some numbness/tingling over the dorsum of her left foot.  She has not had any falls and is compliant with using a left AFO.  No radicular or back pain.    UPDATE 12/18/2022:  She is here for 6 month follow-up visit.  NCS/EMG confirmed the presence of the left peroneal mononeuropathy.  She has been doing her own home exercises and reports marked improvement of foot strength.  She stopped wearing an AFO a month ago and feels that her strength is back to normal.  No numbness/tingling or leg pain.  No falls or imbalance.     Medications:  Current Outpatient Medications on File Prior to  Visit  Medication Sig Dispense Refill   aspirin 81 MG tablet Take 81 mg by mouth daily.     atorvastatin (LIPITOR) 20 MG tablet TAKE 1 TABLET BY MOUTH EVERY DAY 90 tablet 1   Multiple Vitamins-Minerals (MULTIVITAMIN WITH MINERALS) tablet Take 1 tablet by mouth daily.     triamcinolone cream (KENALOG) 0.1 % Apply topically 2 (two) times daily. APPLY TOPICALLY 2 (TWO) TIMES DAILY. 15 g 2   No current facility-administered medications on file prior to visit.    Allergies: No Known Allergies  Vital Signs:  BP 129/73   Pulse 68   Ht 5\' 7"  (1.702 m)   Wt 151 lb (68.5 kg)   SpO2 100%   BMI 23.65 kg/m   Neurological Exam: MENTAL STATUS including orientation to time, place, person, recent and remote memory, attention span and concentration, language, and fund of knowledge is normal.  Speech is not dysarthric.  CRANIAL NERVES:   Normal conjugate, extra-ocular eye movements in all directions of gaze.  No ptosis .  Face is symmetric.   MOTOR:  Motor strength is 5/5 in all extremities, including left dorsiflexion, eversion, and toe extension.  No atrophy, fasciculations or abnormal movements.  No pronator drift.  Tone is normal.    MSRs:  Reflexes are 2+/4 throughout.  SENSORY:  Intact to vibration throughout.  COORDINATION/GAIT:  Gait narrow based and stable. Heel and talk walking intact.  Tandem gait is normal.   Data: NCS/EMG  of the left leg 08/20/2022: The electrophysiologic findings are consistent with a left common peroneal mononeuropathy at or above the takeoff to the tibialis anterior muscle, mild-to-moderate.   Thank you for allowing me to participate in patient's care.  If I can answer any additional questions, I would be pleased to do so.    Sincerely,    Brion Sossamon K. Allena Katz, DO

## 2023-01-12 ENCOUNTER — Ambulatory Visit: Payer: No Typology Code available for payment source

## 2023-04-12 ENCOUNTER — Other Ambulatory Visit: Payer: Self-pay | Admitting: Nurse Practitioner

## 2023-04-12 DIAGNOSIS — E7800 Pure hypercholesterolemia, unspecified: Secondary | ICD-10-CM

## 2023-04-12 NOTE — Telephone Encounter (Signed)
Patient had last office visit for 08/01/23 patient was to follow up in one year.

## 2023-07-26 IMAGING — MG MM DIGITAL SCREENING BILAT W/ TOMO AND CAD
8 series · 8 of 24 positions shown · non-contrast
Comparison: Previous exam(s).

CLINICAL DATA: Screening.

EXAM:
DIGITAL SCREENING BILATERAL MAMMOGRAM WITH TOMOSYNTHESIS AND CAD
TECHNIQUE: Bilateral screening digital craniocaudal and mediolateral oblique
mammograms were obtained. Bilateral screening digital breast
tomosynthesis was performed. The images were evaluated with
computer-aided detection.

[R MLO synth-2D]
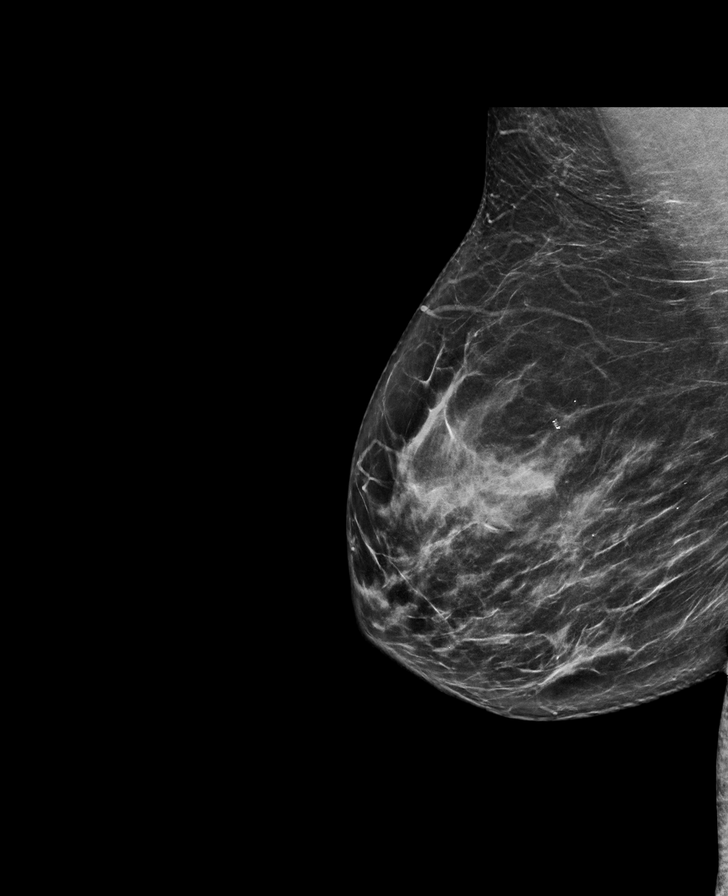

[L CC synth-2D]
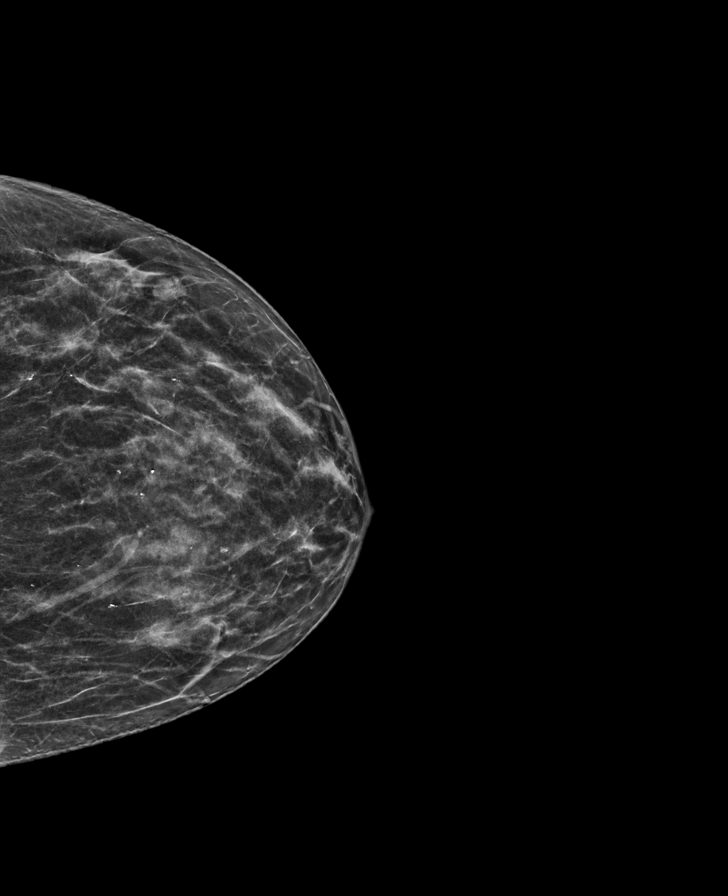

[L MLO synth-2D]
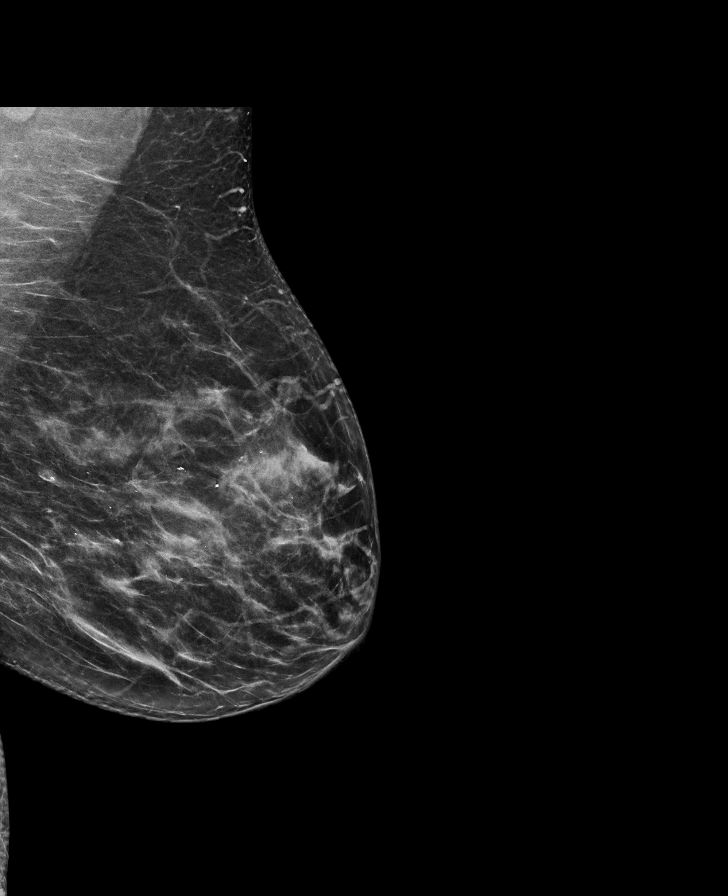

[R CC synth-2D]
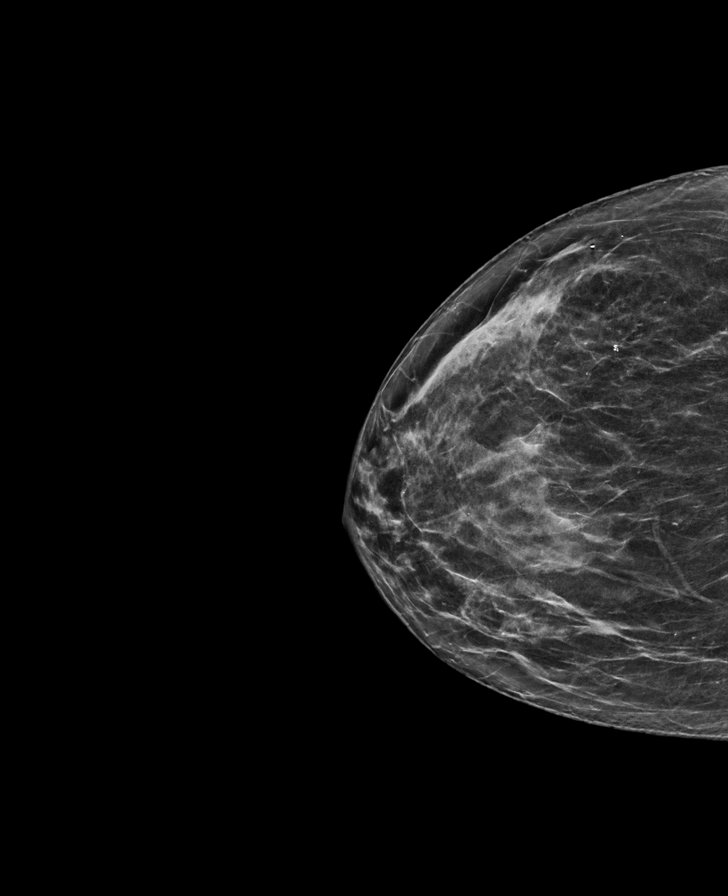

[L MLO tomo · tomo slice 35/70.0]
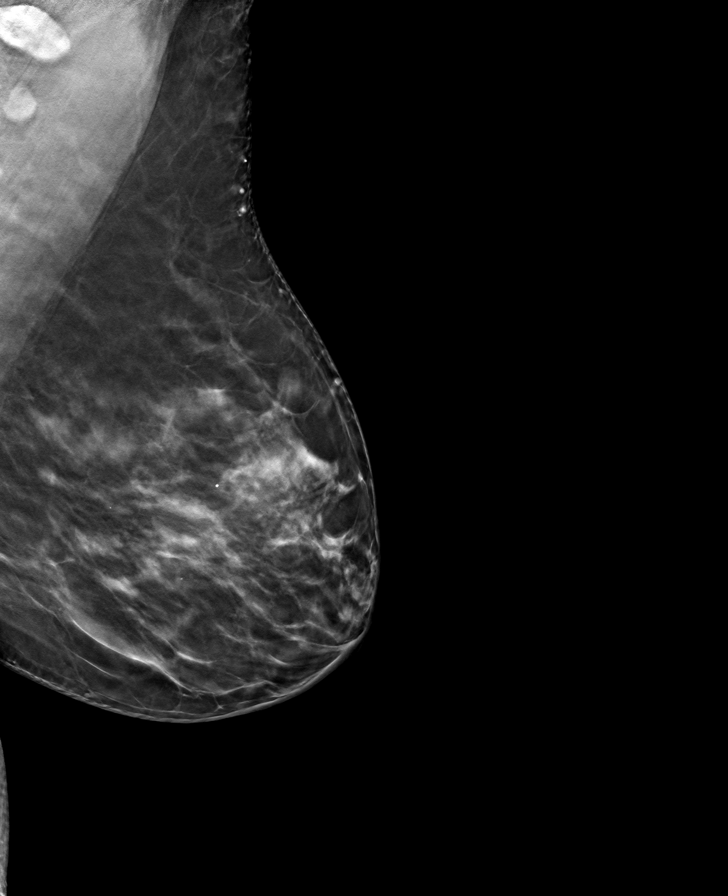

[R MLO tomo · tomo slice 36/71.0]
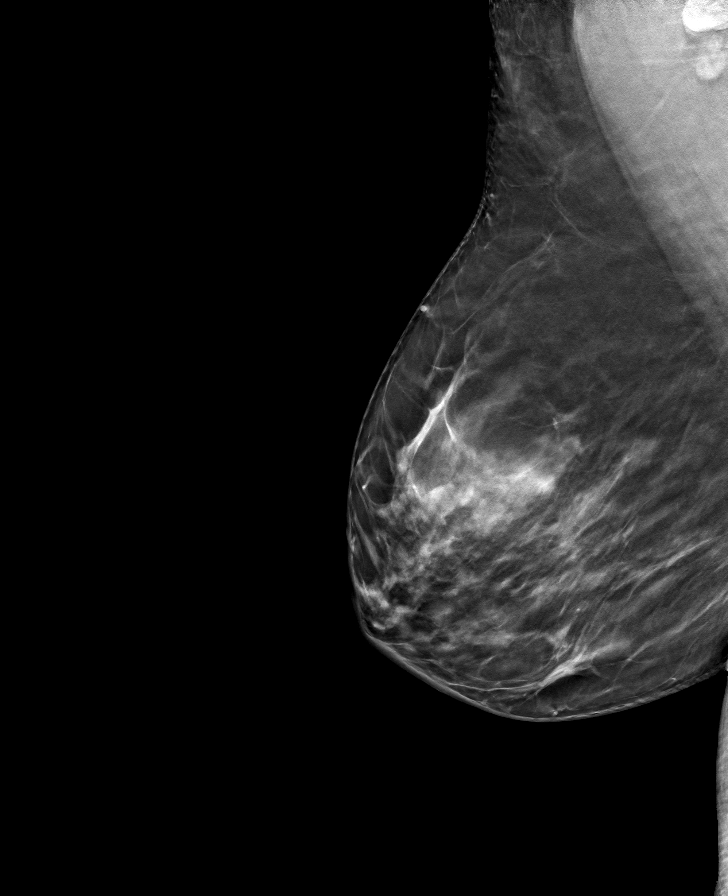

[R CC tomo · tomo slice 31/62.0]
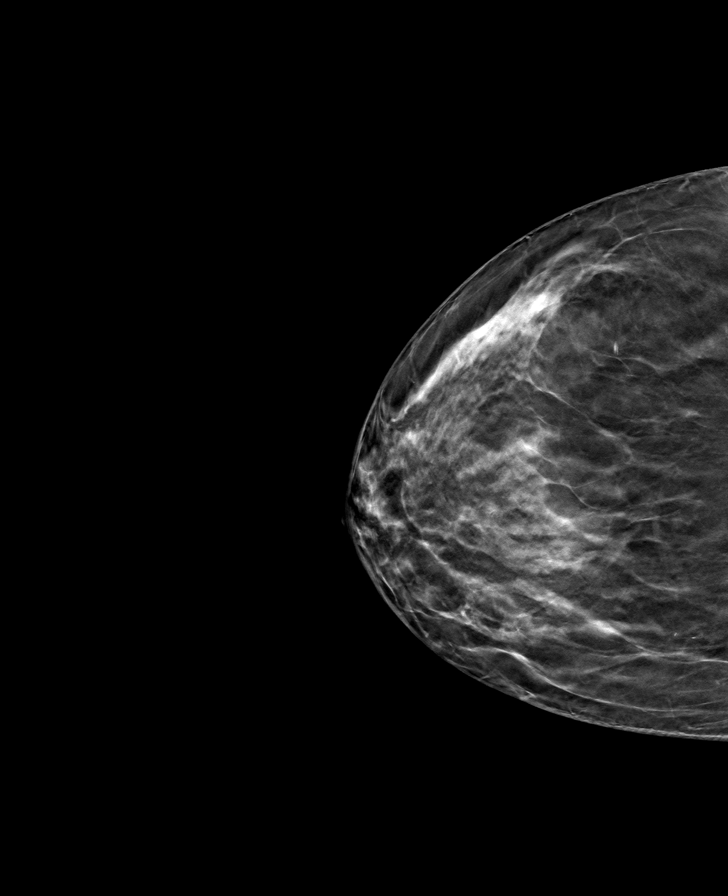

[L CC tomo · tomo slice 29/56.0]
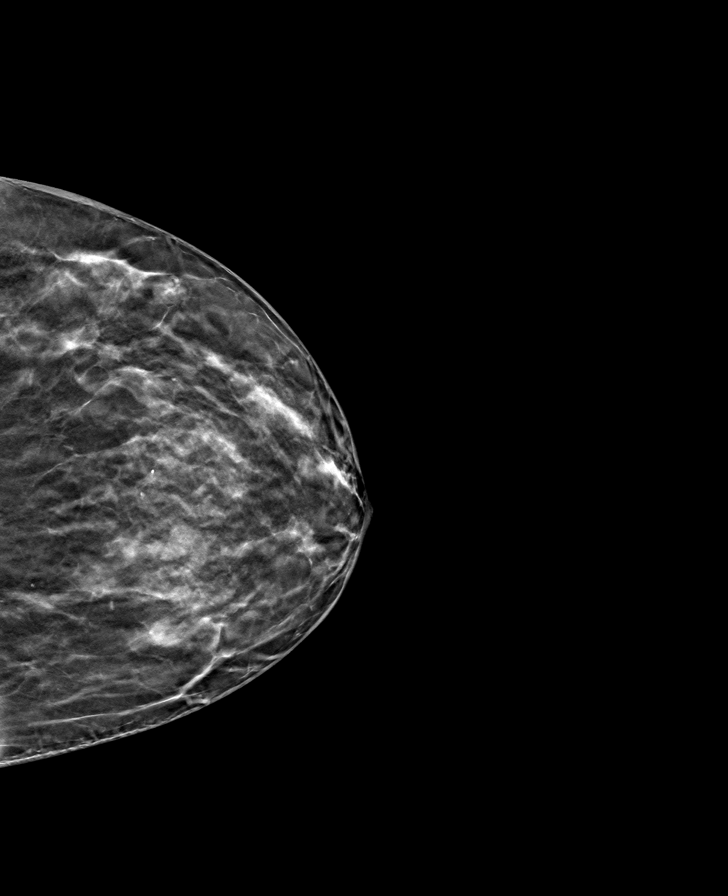

[8 of 24 positions shown; findings below may reference images not displayed]

ACR Breast Density Category c: The breast tissue is heterogeneously
dense, which may obscure small masses.
FINDINGS: There are no findings suspicious for malignancy.
IMPRESSION: No mammographic evidence of malignancy. A result letter of this
screening mammogram will be mailed directly to the patient.

RECOMMENDATION:
Screening mammogram in one year. (Code:Q3-W-BC3)

BI-RADS CATEGORY  1: Negative.

## 2023-10-14 ENCOUNTER — Ambulatory Visit (HOSPITAL_BASED_OUTPATIENT_CLINIC_OR_DEPARTMENT_OTHER): Payer: No Typology Code available for payment source | Admitting: Family Medicine

## 2023-10-22 ENCOUNTER — Ambulatory Visit (HOSPITAL_BASED_OUTPATIENT_CLINIC_OR_DEPARTMENT_OTHER): Payer: No Typology Code available for payment source | Admitting: Family Medicine

## 2024-02-16 ENCOUNTER — Other Ambulatory Visit: Payer: Self-pay | Admitting: Nurse Practitioner

## 2024-02-16 DIAGNOSIS — E7800 Pure hypercholesterolemia, unspecified: Secondary | ICD-10-CM

## 2024-02-18 ENCOUNTER — Ambulatory Visit
Admission: EM | Admit: 2024-02-18 | Discharge: 2024-02-18 | Disposition: A | Discharge status: Home / Self Care | Attending: Family Medicine | Admitting: Family Medicine

## 2024-02-18 ENCOUNTER — Other Ambulatory Visit: Payer: Self-pay

## 2024-02-18 ENCOUNTER — Encounter: Payer: Self-pay | Admitting: *Deleted

## 2024-02-18 DIAGNOSIS — J019 Acute sinusitis, unspecified: Secondary | ICD-10-CM | POA: Diagnosis not present

## 2024-02-18 DIAGNOSIS — Z710 Person encountering health services to consult on behalf of another person: Secondary | ICD-10-CM | POA: Insufficient documentation

## 2024-02-18 MED ORDER — BENZONATATE 100 MG PO CAPS: 100.0000 mg | ORAL_CAPSULE | Freq: Three times a day (TID) | ORAL | 0 refills | Status: DC | PRN

## 2024-02-18 MED ORDER — FLUTICASONE PROPIONATE 50 MCG/ACT NA SUSP: 2.0000 | Freq: Every day | NASAL | 0 refills | Status: AC

## 2024-02-18 MED ORDER — CEFDINIR 300 MG PO CAPS: 600.0000 mg | ORAL_CAPSULE | Freq: Every day | ORAL | 0 refills | Status: AC

## 2024-02-18 NOTE — ED Triage Notes (Signed)
 Cough x 1 week prod for "little bit". States she thinks she got it from her niece. Denies fever. States she is sore from coughing

## 2024-02-18 NOTE — Discharge Instructions (Signed)
 Take cefdinir 300 mg--2 capsules together daily for 7 days  Fluticasone/Flonase nose spray--put 2 sprays in each nostril once daily  Take benzonatate 100 mg, 1 tab every 8 hours as needed for cough.

## 2024-02-18 NOTE — ED Provider Notes (Signed)
 [facility]    CSN: [id number] Arrival date & time: 02/18/24  1018      History   Chief Complaint Chief Complaint  Patient presents with   Cough    HPI Tamara Espinoza is a 63 y.o. female.    Cough Here for congestion and cough.  Symptoms have been going on for about 7 to 8 days, and last night she coughed a lot and is now sore from coughing.  No shortness of breath or wheezing.  No history of asthma.    No fever or chills or nausea or vomiting or diarrhea.  NKDA  No headache, but she does have a lot of postnasal drainage, and her nasal passages are still stopped up some.  Past Medical History:  Diagnosis Date   Anemia    Chicken pox    Chronic cough 06/17/2015   Eczema    Mood changes    Thrombocytosis 04/05/2014    Patient Active Problem List   Diagnosis Date Noted   Person encountering health services to consult on behalf of another person 02/18/2024   Left foot drop 05/05/2022   Preventative health care 07/22/2021   Screening for lung cancer 07/22/2021   Post hysterectomy menopause 07/22/2021   Encounter for screening mammogram for malignant neoplasm of breast 07/22/2021   Asymptomatic microscopic hematuria 07/22/2021   HLD (hyperlipidemia) 08/22/2015   Solitary lung nodule 06/27/2015   Current smoker 08/13/2014   Eczema 08/13/2014   Thrombocytosis 04/05/2014   Tobacco abuse 04/05/2014    Past Surgical History:  Procedure Laterality Date   ABDOMINAL HYSTERECTOMY     BREAST BIOPSY Right 06/27/2010   BREAST BIOPSY Left 02/06/2005   BREAST BIOPSY Bilateral 01/20/2001   BREAST EXCISIONAL BIOPSY Right 2011   BREAST EXCISIONAL BIOPSY  2006   CESAREAN SECTION     DILATION AND CURETTAGE OF UTERUS      OB History   No obstetric history on file.      Home Medications    Prior to Admission medications   Medication Sig Start Date End Date Taking? Authorizing Provider  aspirin 81 MG tablet Take 81 mg by mouth daily.   Yes [provider]  atorvastatin  (LIPITOR) 20 MG tablet TAKE 1 TABLET BY MOUTH EVERY DAY 04/13/23  Yes Dorothe Gaster, NP  benzonatate (TESSALON) 100 MG capsule Take 1 capsule (100 mg total) by mouth 3 (three) times daily as needed for cough. 02/18/24  Yes Shaine Newmark K, MD  cefdinir (OMNICEF) 300 MG capsule Take 2 capsules (600 mg total) by mouth daily for 7 days. 02/18/24 02/25/24 Yes Avri Paiva K, MD  fluticasone (FLONASE) 50 MCG/ACT nasal spray Place 2 sprays into both nostrils daily. 02/18/24  Yes Aasir Daigler K, MD  Multiple Vitamins-Minerals (MULTIVITAMIN WITH MINERALS) tablet Take 1 tablet by mouth daily.   Yes [provider]  triamcinolone  cream (KENALOG ) 0.1 % Apply topically 2 (two) times daily. APPLY TOPICALLY 2 (TWO) TIMES DAILY. 05/05/22  Yes Dorothe Gaster, NP    Family History Family History  Problem Relation Age of Onset   Cancer Mother 29       colon   Hypertension Mother    Colon cancer Mother    Other Father        suicide   Heart disease Father        ?   Hypertension Sister    Cancer Paternal Grandmother        lung ca   Stroke  Neg Hx    Diabetes Neg Hx    Stomach cancer Neg Hx     Social History Social History   Tobacco Use   Smoking status: Every Day    Current packs/day: 0.50    Average packs/day: 0.5 packs/day for 40.0 years (20.0 ttl pk-yrs)    Types: Cigarettes   Smokeless tobacco: Never  Vaping Use   Vaping status: Never Used  Substance Use Topics   Alcohol use: Yes    Alcohol/week: 4.0 standard drinks of alcohol    Types: 4 Standard drinks or equivalent per week    Comment: Beer 2 day 12 oz   Drug use: Yes    Types: Marijuana    Comment: 2 puffs per day     Allergies   Patient has no known allergies.   Review of Systems Review of Systems  Respiratory:  Positive for cough.      Physical Exam Triage Vital Signs ED Triage Vitals  Encounter Vitals Group     BP 02/18/24 1117 126/85     Systolic BP Percentile --       Diastolic BP Percentile --      Pulse Rate 02/18/24 1117 72     Resp 02/18/24 1117 16     Temp 02/18/24 1117 98.2 F (36.8 C)     Temp Source 02/18/24 1117 Oral     SpO2 02/18/24 1117 98 %     Weight --      Height --      Head Circumference --      Peak Flow --      Pain Score 02/18/24 1115 4     Pain Loc --      Pain Education --      Exclude from Growth Chart --    No data found.  Updated Vital Signs BP 126/85 (BP Location: Left Arm)   Pulse 72   Temp 98.2 F (36.8 C) (Oral)   Resp 16   SpO2 98%   Visual Acuity Right Eye Distance:   Left Eye Distance:   Bilateral Distance:    Right Eye Near:   Left Eye Near:    Bilateral Near:     Physical Exam Vitals reviewed.  Constitutional:      General: She is not in acute distress.    Appearance: She is not ill-appearing, toxic-appearing or diaphoretic.  HENT:     Right Ear: Tympanic membrane and ear canal normal.     Left Ear: Tympanic membrane and ear canal normal.     Nose: Nose normal.     Mouth/Throat:     Mouth: Mucous membranes are moist.     Pharynx: No oropharyngeal exudate or posterior oropharyngeal erythema.  Eyes:     Extraocular Movements: Extraocular movements intact.     Conjunctiva/sclera: Conjunctivae normal.     Pupils: Pupils are equal, round, and reactive to light.  Cardiovascular:     Rate and Rhythm: Normal rate and regular rhythm.     Heart sounds: No murmur heard. Pulmonary:     Effort: Pulmonary effort is normal. No respiratory distress.     Breath sounds: No stridor. No wheezing, rhonchi or rales.  Musculoskeletal:     Cervical back: Neck supple.  Lymphadenopathy:     Cervical: No cervical adenopathy.  Skin:    Capillary Refill: Capillary refill takes less than 2 seconds.     Coloration: Skin is not jaundiced or pale.  Neurological:     General: No  focal deficit present.     Mental Status: She is alert and oriented to person, place, and time.  Psychiatric:        Behavior:  Behavior normal.      UC Treatments / Results  Labs (all labs ordered are listed, but only abnormal results are displayed) Labs Reviewed - No data to display  EKG   Radiology No results found.  Procedures Procedures (including critical care time)  Medications Ordered in UC Medications - No data to display  Initial Impression / Assessment and Plan / UC Course  I have reviewed the triage vital signs and the nursing notes.  Pertinent labs & imaging results that were available during my care of the patient were reviewed by me and considered in my medical decision making (see chart for details).     Omnicef is sent in for acute sinusitis along with Flonase to help with swelling in her sinus passages.  Tessalon Perles are sent in for cough.  Final Clinical Impressions(s) / UC Diagnoses   Final diagnoses:  Acute sinusitis, recurrence not specified, unspecified location     Discharge Instructions      Take cefdinir 300 mg--2 capsules together daily for 7 days  Fluticasone/Flonase nose spray--put 2 sprays in each nostril once daily  Take benzonatate 100 mg, 1 tab every 8 hours as needed for cough.     ED Prescriptions     Medication Sig Dispense Auth. Provider   cefdinir (OMNICEF) 300 MG capsule Take 2 capsules (600 mg total) by mouth daily for 7 days. 14 capsule Earnestene Angello K, MD   benzonatate (TESSALON) 100 MG capsule Take 1 capsule (100 mg total) by mouth 3 (three) times daily as needed for cough. 21 capsule Yashua Bracco K, MD   fluticasone [facility]) 50 MCG/ACT nasal spray Place 2 sprays into both nostrils daily. 16 g Ann Keto, MD      PDMP not reviewed this encounter.   Ann Keto, MD 02/18/24 1146

## 2024-06-02 ENCOUNTER — Other Ambulatory Visit: Payer: Self-pay | Admitting: Nurse Practitioner

## 2024-06-02 DIAGNOSIS — E7800 Pure hypercholesterolemia, unspecified: Secondary | ICD-10-CM

## 2024-06-23 ENCOUNTER — Other Ambulatory Visit: Payer: Self-pay

## 2024-06-23 ENCOUNTER — Encounter: Payer: Self-pay | Admitting: *Deleted

## 2024-06-23 ENCOUNTER — Ambulatory Visit
Admission: EM | Admit: 2024-06-23 | Discharge: 2024-06-23 | Disposition: A | Discharge status: Home / Self Care | Attending: Family Medicine | Admitting: Family Medicine

## 2024-06-23 DIAGNOSIS — J069 Acute upper respiratory infection, unspecified: Secondary | ICD-10-CM | POA: Diagnosis not present

## 2024-06-23 LAB — POC SOFIA SARS ANTIGEN FIA: SARS Coronavirus 2 Ag: NEGATIVE

## 2024-06-23 MED ORDER — BENZONATATE 100 MG PO CAPS: 100.0000 mg | ORAL_CAPSULE | Freq: Three times a day (TID) | ORAL | 0 refills | Status: AC | PRN

## 2024-06-23 NOTE — ED Triage Notes (Signed)
 Pt reports productive cough, runny nose, congestion since Wednesday. Denies fever. She says she has been around her niece who has been sick. She has been taking dayquil and nyquil for her symptoms with some relief

## 2024-06-23 NOTE — ED Provider Notes (Signed)
 [facility]    CSN: [id number] Arrival date & time: 06/23/24  1101      History   Chief Complaint Chief Complaint  Patient presents with   Cough    HPI Tamara Espinoza is a 63 y.o. female.    Cough  Here for rhinorrhea and nasal congestion and cough.  Symptoms began on October 8.  No fever or chills and no nausea vomiting or diarrhea.  No headache.  She has been around her 33-year-old niece who was sick with similar symptoms.  Also her sister had been ill.  NKDA She has had a hysterectomy She does smoke cigarettes Last eGFR was 97 Past Medical History:  Diagnosis Date   Anemia    Chicken pox    Chronic cough 06/17/2015   Eczema    Mood changes    Thrombocytosis 04/05/2014    Patient Active Problem List   Diagnosis Date Noted   Person encountering health services to consult on behalf of another person 02/18/2024   Left foot drop 05/05/2022   Preventative health care 07/22/2021   Screening for lung cancer 07/22/2021   Post hysterectomy menopause 07/22/2021   Encounter for screening mammogram for malignant neoplasm of breast 07/22/2021   Asymptomatic microscopic hematuria 07/22/2021   HLD (hyperlipidemia) 08/22/2015   Solitary lung nodule 06/27/2015   Current smoker 08/13/2014   Eczema 08/13/2014   Thrombocytosis 04/05/2014   Tobacco abuse 04/05/2014    Past Surgical History:  Procedure Laterality Date   ABDOMINAL HYSTERECTOMY     BREAST BIOPSY Right 06/27/2010   BREAST BIOPSY Left 02/06/2005   BREAST BIOPSY Bilateral 01/20/2001   BREAST EXCISIONAL BIOPSY Right 2011   BREAST EXCISIONAL BIOPSY  2006   CESAREAN SECTION     DILATION AND CURETTAGE OF UTERUS      OB History   No obstetric history on file.      Home Medications    Prior to Admission medications   Medication Sig Start Date End Date Taking? Authorizing Provider  aspirin 81 MG tablet Take 81 mg by mouth daily.   Yes [provider]  atorvastatin  (LIPITOR) 20 MG  tablet TAKE 1 TABLET BY MOUTH EVERY DAY 04/13/23  Yes Wendee Lynwood HERO, NP  fluticasone  (FLONASE ) 50 MCG/ACT nasal spray Place 2 sprays into both nostrils daily. 02/18/24  Yes Teriana Danker K, MD  Multiple Vitamins-Minerals (MULTIVITAMIN WITH MINERALS) tablet Take 1 tablet by mouth daily.   Yes [provider]  triamcinolone  cream (KENALOG ) 0.1 % Apply topically 2 (two) times daily. APPLY TOPICALLY 2 (TWO) TIMES DAILY. 05/05/22  Yes Wendee Lynwood HERO, NP  benzonatate  (TESSALON ) 100 MG capsule Take 1 capsule (100 mg total) by mouth 3 (three) times daily as needed for cough. 06/23/24   Vonna Sharlet POUR, MD    Family History Family History  Problem Relation Age of Onset   Cancer Mother 89       colon   Hypertension Mother    Colon cancer Mother    Other Father        suicide   Heart disease Father        ?   Hypertension Sister    Cancer Paternal Grandmother        lung ca   Stroke Neg Hx    Diabetes Neg Hx    Stomach cancer Neg Hx     Social History Social History   Tobacco Use   Smoking status: Every Day    Current packs/day: 0.50  Average packs/day: 0.5 packs/day for 40.0 years (20.0 ttl pk-yrs)    Types: Cigarettes   Smokeless tobacco: Never  Vaping Use   Vaping status: Never Used  Substance Use Topics   Alcohol use: Yes    Alcohol/week: 4.0 standard drinks of alcohol    Types: 4 Standard drinks or equivalent per week    Comment: Beer 2 day 12 oz   Drug use: Yes    Types: Marijuana    Comment: 2 puffs per day     Allergies   Patient has no known allergies.   Review of Systems Review of Systems  Respiratory:  Positive for cough.      Physical Exam Triage Vital Signs ED Triage Vitals  Encounter Vitals Group     BP 06/23/24 1236 122/63     Girls Systolic BP Percentile --      Girls Diastolic BP Percentile --      Boys Systolic BP Percentile --      Boys Diastolic BP Percentile --      Pulse Rate 06/23/24 1236 96     Resp 06/23/24 1236 18      Temp 06/23/24 1236 98 F (36.7 C)     Temp Source 06/23/24 1236 Oral     SpO2 06/23/24 1236 98 %     Weight --      Height --      Head Circumference --      Peak Flow --      Pain Score 06/23/24 1233 3     Pain Loc --      Pain Education --      Exclude from Growth Chart --    No data found.  Updated Vital Signs BP 122/63 (BP Location: Left Arm)   Pulse 96   Temp 98 F (36.7 C) (Oral)   Resp 18   SpO2 98%   Visual Acuity Right Eye Distance:   Left Eye Distance:   Bilateral Distance:    Right Eye Near:   Left Eye Near:    Bilateral Near:     Physical Exam Vitals reviewed.  Constitutional:      General: She is not in acute distress.    Appearance: She is not ill-appearing, toxic-appearing or diaphoretic.  HENT:     Right Ear: Tympanic membrane and ear canal normal.     Left Ear: Tympanic membrane and ear canal normal.     Nose: Congestion and rhinorrhea present.     Mouth/Throat:     Mouth: Mucous membranes are moist.     Comments: There is clear exudate draining in the posterior oropharynx.  No erythema Eyes:     Extraocular Movements: Extraocular movements intact.     Conjunctiva/sclera: Conjunctivae normal.     Pupils: Pupils are equal, round, and reactive to light.  Cardiovascular:     Rate and Rhythm: Normal rate and regular rhythm.     Heart sounds: No murmur heard. Pulmonary:     Effort: No respiratory distress.     Breath sounds: No stridor. No wheezing, rhonchi or rales.  Musculoskeletal:     Cervical back: Neck supple.  Lymphadenopathy:     Cervical: No cervical adenopathy.  Skin:    Capillary Refill: Capillary refill takes less than 2 seconds.     Coloration: Skin is not jaundiced or pale.  Neurological:     General: No focal deficit present.     Mental Status: She is alert and oriented to person, place, and  time.  Psychiatric:        Behavior: Behavior normal.      UC Treatments / Results  Labs (all labs ordered are listed, but only  abnormal results are displayed) Labs Reviewed  POC SOFIA SARS ANTIGEN FIA - Normal    EKG   Radiology No results found.  Procedures Procedures (including critical care time)  Medications Ordered in UC Medications - No data to display  Initial Impression / Assessment and Plan / UC Course  I have reviewed the triage vital signs and the nursing notes.  Pertinent labs & imaging results that were available during my care of the patient were reviewed by me and considered in my medical decision making (see chart for details).     COVID test is negative. I sent in Tessalon  Perles for her cough.  I have recommended Mucinex D for the congestion, at least for daytime use.  Her blood pressure here is normal and she does not take antihypertensive medication. Final Clinical Impressions(s) / UC Diagnoses   Final diagnoses:  Viral URI     Discharge Instructions      Your COVID test was negative  Take benzonatate  100 mg, 1 tab every 8 hours as needed for cough.  You might try Mucinex D over-the-counter as needed for the congestion, at least for daytime or morning.  The NyQuil at least can help you sleep at night.     ED Prescriptions     Medication Sig Dispense Auth. Provider   benzonatate  (TESSALON ) 100 MG capsule Take 1 capsule (100 mg total) by mouth 3 (three) times daily as needed for cough. 21 capsule Tikita Mabee K, MD      PDMP not reviewed this encounter.   Vonna Sharlet POUR, MD 06/23/24 1314

## 2024-06-23 NOTE — Discharge Instructions (Addendum)
 Your COVID test was negative  Take benzonatate  100 mg, 1 tab every 8 hours as needed for cough.  You might try Mucinex D over-the-counter as needed for the congestion, at least for daytime or morning.  The NyQuil at least can help you sleep at night.

## 2024-07-28 ENCOUNTER — Ambulatory Visit: Admitting: Internal Medicine

## 2024-07-28 ENCOUNTER — Encounter: Payer: Self-pay | Admitting: Internal Medicine

## 2024-07-28 VITALS — BP 128/80 | HR 75 | Temp 98.2°F | Ht 66.0 in | Wt 149.8 lb

## 2024-07-28 DIAGNOSIS — Z1231 Encounter for screening mammogram for malignant neoplasm of breast: Secondary | ICD-10-CM

## 2024-07-28 DIAGNOSIS — Z720 Tobacco use: Secondary | ICD-10-CM

## 2024-07-28 DIAGNOSIS — Z23 Encounter for immunization: Secondary | ICD-10-CM | POA: Diagnosis not present

## 2024-07-28 DIAGNOSIS — R7303 Prediabetes: Secondary | ICD-10-CM

## 2024-07-28 DIAGNOSIS — E7800 Pure hypercholesterolemia, unspecified: Secondary | ICD-10-CM

## 2024-07-28 DIAGNOSIS — M8588 Other specified disorders of bone density and structure, other site: Secondary | ICD-10-CM

## 2024-07-28 DIAGNOSIS — R911 Solitary pulmonary nodule: Secondary | ICD-10-CM | POA: Diagnosis not present

## 2024-07-28 LAB — COMPREHENSIVE METABOLIC PANEL WITH GFR
ALT: 15 U/L (ref 0–35)
AST: 19 U/L (ref 0–37)
Albumin: 4.2 g/dL (ref 3.5–5.2)
Alkaline Phosphatase: 89 U/L (ref 39–117)
BUN: 11 mg/dL (ref 6–23)
CO2: 26 meq/L (ref 19–32)
Calcium: 9.3 mg/dL (ref 8.4–10.5)
Chloride: 106 meq/L (ref 96–112)
Creatinine, Ser: 0.6 mg/dL (ref 0.40–1.20)
GFR: 96.85 mL/min (ref >60.00)
Glucose, Bld: 83 mg/dL (ref 70–99)
Potassium: 4.1 meq/L (ref 3.5–5.1)
Sodium: 141 meq/L (ref 135–145)
Total Bilirubin: 0.8 mg/dL (ref 0.2–1.2)
Total Protein: 7.3 g/dL (ref 6.0–8.3)

## 2024-07-28 MED ORDER — BUPROPION HCL ER (SR) 150 MG PO TB12: ORAL_TABLET | ORAL | 1 refills | Status: DC

## 2024-07-28 MED ORDER — ATORVASTATIN CALCIUM 20 MG PO TABS: 20.0000 mg | ORAL_TABLET | Freq: Every day | ORAL | 1 refills | Status: AC

## 2024-07-28 NOTE — Progress Notes (Signed)
 [facility] [address] [place] [address] Dept: [phone] Dept Fax: [phone]  New Patient Office Visit  Subjective:   Tamara Espinoza 08-Nov-1960 07/28/2024  Chief Complaint  Patient presents with   Establish Care    No concerns    HPI: Tamara Espinoza presents today to transfer care at [facility] [employer]. Introduced to publishing rights manager role and practice setting.  All questions answered.  Concerns: See below   Discussed the use of AI scribe software for clinical note transcription with the patient, who gave verbal consent to proceed.  History of Present Illness   Tamara Espinoza is a 63 year old female who presents to transfer care as a new patient.  She has a history of tobacco use, smoking approximately one pack per day since the age of 27. She wants to quit smoking and has previously tried nicotine patches and chantix  without success.   History of prediabetes, no current medications.   In 2017, a pulmonary nodule was identified on a lung cancer screening CT. She has not pursued further screening since then.  She has hyperlipidemia and has been taking atorvastatin  20 mg once daily. She has been without her medication for approximately two months.   She has osteopenia, with the last bone density scan conducted in 2023. She has not been taking calcium  or vitamin D  supplements recently.  She has not had a mammogram in a while.   The following portions of the patient's history were reviewed and updated as appropriate: past medical history, past surgical history, family history, social history, allergies, medications, and problem list.   Patient Active Problem List   Diagnosis Date Noted   Prediabetes 07/28/2024   Osteopenia of spine 07/28/2024   Person encountering health services to consult on behalf of another person 02/18/2024   Left foot drop 05/05/2022   Preventative  health care 07/22/2021   Screening for lung cancer 07/22/2021   Post hysterectomy menopause 07/22/2021   Encounter for screening mammogram for malignant neoplasm of breast 07/22/2021   Asymptomatic microscopic hematuria 07/22/2021   HLD (hyperlipidemia) 08/22/2015   Solitary lung nodule 06/27/2015   Current smoker 08/13/2014   Eczema 08/13/2014   Thrombocytosis 04/05/2014   Tobacco abuse 04/05/2014   Past Medical History:  Diagnosis Date   Anemia    Chicken pox    Chronic cough 06/17/2015   Eczema    Mood changes    Thrombocytosis 04/05/2014   Past Surgical History:  Procedure Laterality Date   ABDOMINAL HYSTERECTOMY     BREAST BIOPSY Right 06/27/2010   BREAST BIOPSY Left 02/06/2005   BREAST BIOPSY Bilateral 01/20/2001   BREAST EXCISIONAL BIOPSY Right 2011   BREAST EXCISIONAL BIOPSY  2006   CESAREAN SECTION     DILATION AND CURETTAGE OF UTERUS     Family History  Problem Relation Age of Onset   Cancer Mother 71       colon   Hypertension Mother    Colon cancer Mother    Other Father        suicide   Heart disease Father        ?   Hypertension Sister    Cancer Paternal Grandmother        lung ca   Stroke Neg Hx    Diabetes Neg Hx    Stomach cancer Neg Hx     Current Outpatient Medications:    aspirin 81 MG tablet, Take 81 mg  by mouth daily., Disp: , Rfl:    buPROPion  (WELLBUTRIN  SR) 150 MG 12 hr tablet, Take one tablet (150 mg) by mouth daily for the first 3-5 days, then increase to one tablet twice daily and continue for 7-12 weeks. Take evening dose no later than 6pm., Disp: 60 tablet, Rfl: 1   fluticasone  (FLONASE ) 50 MCG/ACT nasal spray, Place 2 sprays into both nostrils daily., Disp: 16 g, Rfl: 0   Multiple Vitamins-Minerals (MULTIVITAMIN WITH MINERALS) tablet, Take 1 tablet by mouth daily., Disp: , Rfl:    triamcinolone  cream (KENALOG ) 0.1 %, Apply topically 2 (two) times daily. APPLY TOPICALLY 2 (TWO) TIMES DAILY., Disp: 15 g, Rfl: 2   atorvastatin   (LIPITOR) 20 MG tablet, Take 1 tablet (20 mg total) by mouth daily., Disp: 90 tablet, Rfl: 1   benzonatate  (TESSALON ) 100 MG capsule, Take 1 capsule (100 mg total) by mouth 3 (three) times daily as needed for cough. (Patient not taking: Reported on 07/28/2024), Disp: 21 capsule, Rfl: 0 No Known Allergies  ROS: A complete ROS was performed with pertinent positives/negatives noted in the HPI. The remainder of the ROS are negative.   Objective:   Today's Vitals   07/28/24 1019  BP: 128/80  Pulse: 75  Temp: 98.2 F (36.8 C)  TempSrc: Temporal  SpO2: 98%  Weight: 149 lb 12.8 oz (67.9 kg)  Height: 5' 6 (1.676 m)    GENERAL: Well-appearing, in NAD. Well nourished.  SKIN: Pink, warm and dry. No rash, lesion, ulceration, or ecchymoses.  NECK: Trachea midline. Full ROM w/o pain or tenderness. No lymphadenopathy.  RESPIRATORY: Chest wall symmetrical. Respirations even and non-labored. Breath sounds clear to auscultation bilaterally.  CARDIAC: S1, S2 present, regular rate and rhythm. Peripheral pulses 2+ bilaterally.  EXTREMITIES: Without clubbing, cyanosis, or edema.  NEUROLOGIC: No motor or sensory deficits. Steady, even gait.  PSYCH/MENTAL STATUS: Alert, oriented x 3. Cooperative, appropriate mood and affect.   Health Maintenance Due  Topic Date Due   Zoster Vaccines- Shingrix (1 of 2) Never done   Pneumococcal Vaccine: 50+ Years (2 of 2 - PCV) 09/27/2013   Colonoscopy  10/30/2019   Mammogram  09/05/2023    No results found for any visits on 07/28/24.  Assessment & Plan:  Assessment and Plan  Tobacco use disorder Chronic tobacco use.  Discussed Wellbutrin  for cessation and emphasized gradual reduction. - Prescribed Wellbutrin , starting with one tablet once daily for 3-5 days, then increase to two tablets daily. - Scheduled follow-up in 6 weeks to assess progress with smoking cessation.  Pure hypercholesterolemia Managed with atorvastatin , but she has been off medication for  two months. - Prescribed atorvastatin  20 mg daily. - not fasting for labs  Prediabetes Previous A1c in prediabetes range. Blood work ordered to reassess. - Ordered A1c and CMP   Solitary pulmonary nodule Nodule identified on previous screening. Discussed annual screening, but patient declined.  Osteopenia Discussed importance of calcium , vitamin D , and exercises. - Recommended daily calcium  and vitamin D  supplementation. - Encouraged weight-bearing exercises and light weight lifting. - Declines repeat screening  Breast cancer screening (mammogram) Due for routine mammogram and agreed to proceed. - Ordered mammogram at [facility].  Immunization (influenza) She expressed interest in receiving the influenza vaccine. - Administered influenza vaccine.        Orders Placed This Encounter  Procedures   MM 3D SCREENING MAMMOGRAM BILATERAL BREAST    Standing Status:   Future    Expiration Date:   07/28/2025  Reason for Exam (SYMPTOM  OR DIAGNOSIS REQUIRED):   screening for breast cancer    Preferred imaging location?:   [facility]   Flu vaccine trivalent PF, 6mos and older(Flulaval,Afluria,Fluarix,Fluzone)   Comp Met (CMET)   Hemoglobin A1C   Meds ordered this encounter  Medications   atorvastatin  (LIPITOR) 20 MG tablet    Sig: Take 1 tablet (20 mg total) by mouth daily.    Dispense:  90 tablet    Refill:  1   buPROPion  (WELLBUTRIN  SR) 150 MG 12 hr tablet    Sig: Take one tablet (150 mg) by mouth daily for the first 3-5 days, then increase to one tablet twice daily and continue for 7-12 weeks. Take evening dose no later than 6pm.    Dispense:  60 tablet    Refill:  1    Supervising Provider:   THOMPSON, AARON B [[id number]]    Return in about 6 weeks (around 09/08/2024) for smoking cessation.   Rosina Senters, FNP

## 2024-07-31 LAB — HEMOGLOBIN A1C: Hgb A1c MFr Bld: 6 % (ref 4.6–6.5)

## 2024-08-01 ENCOUNTER — Ambulatory Visit: Payer: Self-pay | Admitting: Internal Medicine

## 2024-08-01 NOTE — Telephone Encounter (Signed)
 Reached out by phone to give pt lab result unable to LVM due to not having voicemail set up on phone also not on MyChart.

## 2024-08-02 ENCOUNTER — Telehealth: Payer: Self-pay

## 2024-08-02 NOTE — Telephone Encounter (Signed)
 LVM on home phone for pt to call office to get lab results

## 2024-08-03 ENCOUNTER — Telehealth: Payer: Self-pay

## 2024-08-03 NOTE — Telephone Encounter (Signed)
 Copied from CRM [id number]. Topic: General - Call Back - No Documentation >> Aug 02, 2024  2:28 PM Alfonso ORN wrote: Reason for CRM: pt husband calling on behalf of pt to return call. He would rather pt talk directly with provider regarding lab results if not an emergency and will have pt to call back to discuss.  Please contact pt [phone] (M)

## 2024-08-03 NOTE — Telephone Encounter (Signed)
 Called and advised him that labs were normal and to continue routine office visits.  No further questions. Dm/cma

## 2024-08-03 NOTE — Telephone Encounter (Signed)
 I have been unable to reach this patient by phone.  A letter is being sent to the last known home address.

## 2024-08-20 ENCOUNTER — Other Ambulatory Visit: Payer: Self-pay | Admitting: Internal Medicine

## 2024-08-20 DIAGNOSIS — Z720 Tobacco use: Secondary | ICD-10-CM

## 2024-08-22 NOTE — Telephone Encounter (Signed)
 Change request received for Bupropion  150mg  FOV:10/06/2024 LOV:07/28/2024 Last refill:07/28/2024 Medication is pending your approval.

## 2024-10-06 ENCOUNTER — Encounter: Payer: Self-pay | Admitting: Internal Medicine

## 2024-10-06 ENCOUNTER — Ambulatory Visit: Admitting: Internal Medicine

## 2024-10-06 VITALS — BP 128/80 | HR 67 | Temp 97.7°F | Ht 67.0 in | Wt 156.0 lb

## 2024-10-06 DIAGNOSIS — Z23 Encounter for immunization: Secondary | ICD-10-CM

## 2024-10-06 DIAGNOSIS — M19042 Primary osteoarthritis, left hand: Secondary | ICD-10-CM | POA: Diagnosis not present

## 2024-10-06 DIAGNOSIS — L910 Hypertrophic scar: Secondary | ICD-10-CM | POA: Diagnosis not present

## 2024-10-06 DIAGNOSIS — L2082 Flexural eczema: Secondary | ICD-10-CM

## 2024-10-06 DIAGNOSIS — Z720 Tobacco use: Secondary | ICD-10-CM

## 2024-10-06 DIAGNOSIS — M19041 Primary osteoarthritis, right hand: Secondary | ICD-10-CM | POA: Diagnosis not present

## 2024-10-06 DIAGNOSIS — Z1211 Encounter for screening for malignant neoplasm of colon: Secondary | ICD-10-CM

## 2024-10-06 MED ORDER — TRIAMCINOLONE ACETONIDE 0.1 % EX CREA: TOPICAL_CREAM | Freq: Two times a day (BID) | CUTANEOUS | 2 refills | Status: AC

## 2024-10-06 MED ORDER — DICLOFENAC SODIUM 1 % EX GEL: 4.0000 g | Freq: Four times a day (QID) | CUTANEOUS | 1 refills | Status: AC | PRN

## 2024-10-06 NOTE — Patient Instructions (Signed)
 Call for Mammogram  [address], [place], [place] [address] Phone: [phone]

## 2024-10-06 NOTE — Progress Notes (Signed)
 " [facility] [address] [place] [address] Dept: [phone] Dept Fax: [phone]    Subjective:   Tamara Espinoza 02-Jun-1961 10/06/2024  Chief Complaint  Patient presents with   Follow-up    6 weeks mammogram, fell problem spot on face     HPI: Tamara Espinoza presents today for re-assessment and management of chronic medical conditions.  Discussed the use of AI scribe software for clinical note transcription with the patient, who gave verbal consent to proceed.  History of Present Illness   Tamara Espinoza MI MI is a 64 year old female who presents for follow-up on tobacco cessation. She is accompanied by her husband.  She is actively working on tobacco cessation and is currently taking bupropion  SR 150 mg. Her smoking has decreased significantly from a pack a day to one cigarette at work and two to three at home.  She experiences pain and stiffness in her hands and feet, particularly in cold weather, which improves with Tylenol or Aleve.  She reports she fell 1 year ago, injuring her nose and upper lip. She has since developed scar tissue to left upper lip and does not like the appearance. She would like to get this removed.   She has eczema and uses Kenalog  cream, requesting a larger jar for convenience.  She is due for a mammogram and a colonoscopy, with the mammogram order placed at a previous visit.       The following portions of the patient's history were reviewed and updated as appropriate: past medical history, past surgical history, family history, social history, allergies, medications, and problem list.   Patient Active Problem List   Diagnosis Date Noted   Primary osteoarthritis of both hands 10/06/2024   Prediabetes 07/28/2024   Osteopenia of spine 07/28/2024   Person encountering health services to consult on behalf of another person 02/18/2024   Left foot drop 05/05/2022   Preventative  health care 07/22/2021   Screening for lung cancer 07/22/2021   Post hysterectomy menopause 07/22/2021   Encounter for screening mammogram for malignant neoplasm of breast 07/22/2021   Asymptomatic microscopic hematuria 07/22/2021   HLD (hyperlipidemia) 08/22/2015   Solitary lung nodule 06/27/2015   Current smoker 08/13/2014   Eczema 08/13/2014   Thrombocytosis 04/05/2014   Tobacco abuse 04/05/2014   Past Medical History:  Diagnosis Date   Anemia    Chicken pox    Chronic cough 06/17/2015   Eczema    Mood changes    Thrombocytosis 04/05/2014   Past Surgical History:  Procedure Laterality Date   ABDOMINAL HYSTERECTOMY     BREAST BIOPSY Right 06/27/2010   BREAST BIOPSY Left 02/06/2005   BREAST BIOPSY Bilateral 01/20/2001   BREAST EXCISIONAL BIOPSY Right 2011   BREAST EXCISIONAL BIOPSY  2006   CESAREAN SECTION     DILATION AND CURETTAGE OF UTERUS     Family History  Problem Relation Age of Onset   Cancer Mother 31       colon   Hypertension Mother    Colon cancer Mother    Other Father        suicide   Heart disease Father        ?   Hypertension Sister    Cancer Paternal Grandmother        lung ca   Stroke Neg Hx    Diabetes Neg Hx    Stomach cancer Neg Hx    Current Medications[1] Allergies[2]  ROS: A complete ROS was performed with pertinent positives/negatives noted in the HPI. The remainder of the ROS are negative.    Objective:   Today's Vitals   10/06/24 0945  BP: 128/80  Pulse: 67  Temp: 97.7 F (36.5 C)  TempSrc: Temporal  SpO2: 99%  Weight: 156 lb (70.8 kg)  Height: 5' 7 (1.702 m)    GENERAL: Well-appearing, in NAD. Well nourished.  SKIN: Pink, warm and dry. Keloid below left nare  NECK: Trachea midline. Full ROM w/o pain or tenderness. No lymphadenopathy.  RESPIRATORY: Chest wall symmetrical. Respirations even and non-labored. Breath sounds clear to auscultation bilaterally.  CARDIAC: S1, S2 present, regular rate and rhythm. Peripheral  pulses 2+ bilaterally.  MSK: Muscle tone and strength appropriate for age. Heberden and bouchard nodes to PIP and DIP joints of bilateral hands.  EXTREMITIES: Without clubbing, cyanosis, or edema.  NEUROLOGIC:  Steady, even gait.  PSYCH/MENTAL STATUS: Alert, oriented x 3. Cooperative, appropriate mood and affect.   Health Maintenance Due  Topic Date Due   Zoster Vaccines- Shingrix (1 of 2) Never done   Colonoscopy  10/30/2019   Mammogram  09/04/2022    No results found for any visits on 10/06/24.  The 10-year ASCVD risk score (Arnett DK, et al., 2019) is: 9.9%     Assessment & Plan:  Assessment and Plan    Tobacco use disorder Smoking reduced to 1-3 cigarettes per day with bupropion  SR 150 mg. - Continue bupropion  SR 150 mg for tobacco cessation.  Flexural eczema Requires Kenalog  cream refill. - Prescribed Kenalog  cream   Primary osteoarthritis of both hands Stiffness and soreness managed with Tylenol or Aleve. Discussed Voltaren  gel and non-pharmacological measures. - Prescribed Voltaren  gel for topical use up to four times a day. - Recommended Tylenol arthritis or regular strength Tylenol as needed. - Advised use of heating pad and copper gloves for symptom relief.  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 Discussed referral to plastic surgery for steroid injection. - Placed referral to plastic surgery for evaluation and possible steroid injection.  Colon cancer screening Overdue for colonoscopy. Referral placed. - Ordered colonoscopy.  Pneumococcal vaccination Due for pneumococcal vaccine. Discussed benefits and minimal side effects. - Administered pneumococcal vaccine.  Breast cancer screening Overdue for mammogram. Order placed at previous visit. - Provided contact information for mammogram scheduling.       Orders Placed This Encounter  Procedures   Pneumococcal conjugate vaccine 20-valent (PCV20)   Ambulatory referral to Gastroenterology    Referral  Priority:   Routine    Referral Type:   Consultation    Referral Reason:   Specialty Services Required    Number of Visits Requested:   1   Ambulatory referral to Plastic Surgery    Referral Priority:   Routine    Referral Type:   Surgical    Referral Reason:   Specialty Services Required    Requested Specialty:   Plastic Surgery    Number of Visits Requested:   1   No images are attached to the encounter or orders placed in the encounter. Meds ordered this encounter  Medications   triamcinolone  cream (KENALOG ) 0.1 %    Sig: Apply topically 2 (two) times daily. APPLY TOPICALLY 2 (TWO) TIMES DAILY.    Dispense:  15 g    Refill:  2    NEED JAR NOT TUBE   diclofenac  Sodium (VOLTAREN ) 1 % GEL    Sig: Apply 4 g topically 4 (four) times daily  as needed (joint pain).    Dispense:  150 g    Refill:  1    Supervising Provider:   SEBASTIAN BEVERLEY NOVAK [[id number]]    Return in about 6 months (around 04/05/2025) for Cholesterol, predm - fasting labs.   Rosina Senters, FNP     [1]  Current Outpatient Medications:    aspirin 81 MG tablet, Take 81 mg by mouth daily., Disp: , Rfl:    atorvastatin  (LIPITOR) 20 MG tablet, Take 1 tablet (20 mg total) by mouth daily., Disp: 90 tablet, Rfl: 1   buPROPion  (WELLBUTRIN  SR) 150 MG 12 hr tablet, TAKE ONE TABLET (150 MG) BY MOUTH DAILY FOR THE FIRST 3-5 DAYS, THEN INCREASE TO ONE TABLET TWICE DAILY AND CONTINUE FOR 7-12 WEEKS. TAKE EVENING DOSE NO LATER THAN 6PM., Disp: 180 tablet, Rfl: 1   diclofenac  Sodium (VOLTAREN ) 1 % GEL, Apply 4 g topically 4 (four) times daily as needed (joint pain)., Disp: 150 g, Rfl: 1   Multiple Vitamins-Minerals (MULTIVITAMIN WITH MINERALS) tablet, Take 1 tablet by mouth daily., Disp: , Rfl:    benzonatate  (TESSALON ) 100 MG capsule, Take 1 capsule (100 mg total) by mouth 3 (three) times daily as needed for cough. (Patient not taking: Reported on 07/28/2024), Disp: 21 capsule, Rfl: 0   fluticasone  (FLONASE ) 50 MCG/ACT nasal spray,  Place 2 sprays into both nostrils daily. (Patient not taking: Reported on 10/06/2024), Disp: 16 g, Rfl: 0   triamcinolone  cream (KENALOG ) 0.1 %, Apply topically 2 (two) times daily. APPLY TOPICALLY 2 (TWO) TIMES DAILY., Disp: 15 g, Rfl: 2 [2] No Known Allergies  "

## 2024-11-10 ENCOUNTER — Institutional Professional Consult (permissible substitution)

## 2025-04-06 ENCOUNTER — Ambulatory Visit: Admitting: Internal Medicine
